# Patient Record
Sex: Female | Born: 1945 | Race: White | Hispanic: No | Smoking: Never smoker
Health system: Southern US, Community
[De-identification: ages and names within clinical notes are randomized; demographics above are authoritative.]

## PROBLEM LIST (undated history)

## (undated) DIAGNOSIS — E119 Type 2 diabetes mellitus without complications: Secondary | ICD-10-CM

## (undated) HISTORY — PX: DIAGNOSTIC LAPAROSCOPY: SUR761

## (undated) HISTORY — DX: Type 2 diabetes mellitus without complications: E11.9

## (undated) HISTORY — PX: APPENDECTOMY: SHX54

---

## 2004-06-07 ENCOUNTER — Ambulatory Visit: Payer: Self-pay | Admitting: Internal Medicine

## 2006-01-29 ENCOUNTER — Ambulatory Visit: Payer: Self-pay | Admitting: Internal Medicine

## 2010-04-11 ENCOUNTER — Ambulatory Visit: Payer: Self-pay | Admitting: Internal Medicine

## 2011-10-17 ENCOUNTER — Ambulatory Visit: Payer: Self-pay | Admitting: Internal Medicine

## 2012-10-20 ENCOUNTER — Ambulatory Visit: Payer: Self-pay | Admitting: Internal Medicine

## 2014-11-28 ENCOUNTER — Other Ambulatory Visit: Payer: Self-pay | Admitting: Internal Medicine

## 2014-11-28 DIAGNOSIS — Z1231 Encounter for screening mammogram for malignant neoplasm of breast: Secondary | ICD-10-CM

## 2019-01-22 DIAGNOSIS — C4491 Basal cell carcinoma of skin, unspecified: Secondary | ICD-10-CM

## 2019-01-22 HISTORY — DX: Basal cell carcinoma of skin, unspecified: C44.91

## 2019-02-26 ENCOUNTER — Other Ambulatory Visit: Payer: Self-pay | Admitting: Internal Medicine

## 2019-02-26 DIAGNOSIS — Z1231 Encounter for screening mammogram for malignant neoplasm of breast: Secondary | ICD-10-CM

## 2019-04-21 ENCOUNTER — Ambulatory Visit
Admission: RE | Admit: 2019-04-21 | Discharge: 2019-04-21 | Disposition: A | Discharge status: Home / Self Care | Payer: Medicare Other | Source: Ambulatory Visit | Attending: Internal Medicine | Admitting: Internal Medicine

## 2019-04-21 ENCOUNTER — Encounter: Payer: Self-pay | Admitting: Radiology

## 2019-04-21 DIAGNOSIS — Z1231 Encounter for screening mammogram for malignant neoplasm of breast: Secondary | ICD-10-CM

## 2019-04-28 ENCOUNTER — Ambulatory Visit: Payer: Self-pay

## 2019-08-16 ENCOUNTER — Ambulatory Visit: Payer: Medicare Other | Attending: Internal Medicine

## 2019-08-16 DIAGNOSIS — Z23 Encounter for immunization: Secondary | ICD-10-CM

## 2019-08-16 NOTE — Progress Notes (Signed)
   Covid-19 Vaccination Clinic  Name:  VANELY SHARBER    MRN: [id number] DOB: [birth year]  08/16/2019  Ms. Lemberger was observed post Covid-19 immunization for 15 minutes without incident. She was provided with Vaccine Information Sheet and instruction to access the V-Safe system.   Ms. Scheckel was instructed to call 911 with any severe reactions post vaccine: Marland Kitchen Difficulty breathing  . Swelling of face and throat  . A fast heartbeat  . A bad rash all over body  . Dizziness and weakness   Immunizations Administered    Name Date Dose VIS Date Route   Pfizer COVID-19 Vaccine 08/16/2019  8:17 AM 0.3 mL 04/23/2019 Intramuscular   Manufacturer: [place]   Lot: [id number]   [place]: [id number]

## 2019-09-14 ENCOUNTER — Encounter: Payer: Medicare Other | Admitting: Dermatology

## 2019-09-14 ENCOUNTER — Ambulatory Visit: Payer: Medicare Other

## 2019-10-01 ENCOUNTER — Ambulatory Visit: Payer: Medicare Other | Attending: Internal Medicine

## 2019-10-01 DIAGNOSIS — Z23 Encounter for immunization: Secondary | ICD-10-CM

## 2019-10-01 NOTE — Progress Notes (Signed)
   Covid-19 Vaccination Clinic  Name:  Alexandra Novak    MRN: [id number] DOB: 1946/02/10  10/01/2019  [name] was observed post Covid-19 immunization for 15 minutes without incident. She was provided with Vaccine Information Sheet and instruction to access the V-Safe system.   [name] was instructed to call 911 with any severe reactions post vaccine: Marland Kitchen Difficulty breathing  . Swelling of face and throat  . A fast heartbeat  . A bad rash all over body  . Dizziness and weakness   Immunizations Administered    Name Date Dose VIS Date Route   Pfizer COVID-19 Vaccine 10/01/2019  8:02 AM 0.3 mL 07/07/2018 Intramuscular   Manufacturer: [place]   Lot: [id number]   [place]: [id number]

## 2020-09-02 ENCOUNTER — Other Ambulatory Visit: Payer: Self-pay

## 2020-09-02 ENCOUNTER — Inpatient Hospital Stay
Admission: EM | Admit: 2020-09-02 | Discharge: 2020-09-04 | DRG: 339 | Disposition: A | Discharge status: Home / Self Care | Payer: Medicare Other | Attending: Surgery | Admitting: Surgery

## 2020-09-02 ENCOUNTER — Emergency Department: Payer: Medicare Other | Admitting: Anesthesiology

## 2020-09-02 ENCOUNTER — Emergency Department: Payer: Medicare Other

## 2020-09-02 ENCOUNTER — Encounter
Admission: EM | Disposition: A | Discharge status: Home / Self Care | Payer: Self-pay | Source: Home / Self Care | Attending: Surgery

## 2020-09-02 DIAGNOSIS — E119 Type 2 diabetes mellitus without complications: Secondary | ICD-10-CM

## 2020-09-02 DIAGNOSIS — C7A00 Malignant carcinoid tumor of unspecified site: Secondary | ICD-10-CM | POA: Diagnosis not present

## 2020-09-02 DIAGNOSIS — K3580 Unspecified acute appendicitis: Secondary | ICD-10-CM | POA: Diagnosis not present

## 2020-09-02 DIAGNOSIS — Z7984 Long term (current) use of oral hypoglycemic drugs: Secondary | ICD-10-CM | POA: Diagnosis not present

## 2020-09-02 DIAGNOSIS — E1122 Type 2 diabetes mellitus with diabetic chronic kidney disease: Secondary | ICD-10-CM | POA: Diagnosis present

## 2020-09-02 DIAGNOSIS — N736 Female pelvic peritoneal adhesions (postinfective): Secondary | ICD-10-CM | POA: Diagnosis present

## 2020-09-02 DIAGNOSIS — N179 Acute kidney failure, unspecified: Secondary | ICD-10-CM | POA: Diagnosis present

## 2020-09-02 DIAGNOSIS — N183 Type 2 diabetes mellitus with diabetic chronic kidney disease: Secondary | ICD-10-CM

## 2020-09-02 DIAGNOSIS — Z85828 Personal history of other malignant neoplasm of skin: Secondary | ICD-10-CM | POA: Diagnosis not present

## 2020-09-02 DIAGNOSIS — I129 Hypertensive chronic kidney disease with stage 1 through stage 4 chronic kidney disease, or unspecified chronic kidney disease: Secondary | ICD-10-CM | POA: Diagnosis present

## 2020-09-02 DIAGNOSIS — E785 Hyperlipidemia, unspecified: Secondary | ICD-10-CM | POA: Diagnosis present

## 2020-09-02 DIAGNOSIS — Z6841 Body Mass Index (BMI) 40.0 and over, adult: Secondary | ICD-10-CM | POA: Diagnosis not present

## 2020-09-02 DIAGNOSIS — I10 Essential (primary) hypertension: Secondary | ICD-10-CM

## 2020-09-02 DIAGNOSIS — K3532 Acute appendicitis with perforation and localized peritonitis, without abscess: Secondary | ICD-10-CM | POA: Diagnosis present

## 2020-09-02 DIAGNOSIS — K353 Acute appendicitis with localized peritonitis, without perforation or gangrene: Secondary | ICD-10-CM | POA: Diagnosis not present

## 2020-09-02 DIAGNOSIS — R109 Unspecified abdominal pain: Secondary | ICD-10-CM | POA: Diagnosis present

## 2020-09-02 DIAGNOSIS — A419 Sepsis, unspecified organism: Secondary | ICD-10-CM

## 2020-09-02 DIAGNOSIS — Z20822 Contact with and (suspected) exposure to covid-19: Secondary | ICD-10-CM | POA: Diagnosis present

## 2020-09-02 DIAGNOSIS — K859 Acute pancreatitis without necrosis or infection, unspecified: Secondary | ICD-10-CM | POA: Diagnosis present

## 2020-09-02 DIAGNOSIS — Z79899 Other long term (current) drug therapy: Secondary | ICD-10-CM

## 2020-09-02 DIAGNOSIS — Z794 Long term (current) use of insulin: Secondary | ICD-10-CM | POA: Diagnosis not present

## 2020-09-02 DIAGNOSIS — K3533 Acute appendicitis with perforation and localized peritonitis, with abscess: Secondary | ICD-10-CM

## 2020-09-02 DIAGNOSIS — C785 Secondary malignant neoplasm of large intestine and rectum: Secondary | ICD-10-CM | POA: Diagnosis not present

## 2020-09-02 DIAGNOSIS — N1832 Chronic kidney disease, stage 3b: Secondary | ICD-10-CM | POA: Diagnosis present

## 2020-09-02 HISTORY — PX: LAPAROSCOPIC APPENDECTOMY: SHX408

## 2020-09-02 LAB — LIPASE, BLOOD: Lipase: 20 U/L (ref 11–51)

## 2020-09-02 LAB — URINALYSIS, COMPLETE (UACMP) WITH MICROSCOPIC
Bilirubin Urine: NEGATIVE
Glucose, UA: NEGATIVE mg/dL
Hgb urine dipstick: NEGATIVE
Ketones, ur: 5 mg/dL — AB
Leukocytes,Ua: NEGATIVE
Nitrite: NEGATIVE
Protein, ur: NEGATIVE mg/dL
Specific Gravity, Urine: 1.016 (ref 1.005–1.030)
Squamous Epithelial / HPF: NONE SEEN (ref 0–5)
pH: 5 (ref 5.0–8.0)

## 2020-09-02 LAB — COMPREHENSIVE METABOLIC PANEL
ALT: 14 U/L (ref 0–44)
AST: 17 U/L (ref 15–41)
Albumin: 3.5 g/dL (ref 3.5–5.0)
Alkaline Phosphatase: 73 U/L (ref 38–126)
Anion gap: 13 (ref 5–15)
BUN: 29 mg/dL — ABNORMAL HIGH (ref 8–23)
CO2: 23 mmol/L (ref 22–32)
Calcium: 9 mg/dL (ref 8.9–10.3)
Chloride: 98 mmol/L (ref 98–111)
Creatinine, Ser: 1.18 mg/dL — ABNORMAL HIGH (ref 0.44–1.00)
GFR, Estimated: 48 mL/min — ABNORMAL LOW (ref >60)
Glucose, Bld: 185 mg/dL — ABNORMAL HIGH (ref 70–99)
Potassium: 3.6 mmol/L (ref 3.5–5.1)
Sodium: 134 mmol/L — ABNORMAL LOW (ref 135–145)
Total Bilirubin: 0.8 mg/dL (ref 0.3–1.2)
Total Protein: 8.1 g/dL (ref 6.5–8.1)

## 2020-09-02 LAB — CBC WITH DIFFERENTIAL/PLATELET
Abs Immature Granulocytes: 0.09 10*3/uL — ABNORMAL HIGH (ref 0.00–0.07)
Basophils Absolute: 0.1 10*3/uL (ref 0.0–0.1)
Basophils Relative: 0 %
Eosinophils Absolute: 0.1 10*3/uL (ref 0.0–0.5)
Eosinophils Relative: 0 %
HCT: 38.4 % (ref 36.0–46.0)
Hemoglobin: 12.9 g/dL (ref 12.0–15.0)
Immature Granulocytes: 0 %
Lymphocytes Relative: 25 %
Lymphs Abs: 5.1 10*3/uL — ABNORMAL HIGH (ref 0.7–4.0)
MCH: 29.7 pg (ref 26.0–34.0)
MCHC: 33.6 g/dL (ref 30.0–36.0)
MCV: 88.3 fL (ref 80.0–100.0)
Monocytes Absolute: 1.6 10*3/uL — ABNORMAL HIGH (ref 0.1–1.0)
Monocytes Relative: 8 %
Neutro Abs: 13.7 10*3/uL — ABNORMAL HIGH (ref 1.7–7.7)
Neutrophils Relative %: 67 %
Platelets: 426 10*3/uL — ABNORMAL HIGH (ref 150–400)
RBC: 4.35 MIL/uL (ref 3.87–5.11)
RDW: 13.5 % (ref 11.5–15.5)
WBC: 20.6 10*3/uL — ABNORMAL HIGH (ref 4.0–10.5)
nRBC: 0 % (ref 0.0–0.2)

## 2020-09-02 LAB — RESP PANEL BY RT-PCR (FLU A&B, COVID) ARPGX2
Influenza A by PCR: NEGATIVE
Influenza B by PCR: NEGATIVE
SARS Coronavirus 2 by RT PCR: NEGATIVE

## 2020-09-02 LAB — MAGNESIUM: Magnesium: 2.2 mg/dL (ref 1.7–2.4)

## 2020-09-02 LAB — CBG MONITORING, ED: Glucose-Capillary: 197 mg/dL — ABNORMAL HIGH (ref 70–99)

## 2020-09-02 SURGERY — APPENDECTOMY, LAPAROSCOPIC
Anesthesia: General

## 2020-09-02 MED ORDER — HYDROMORPHONE HCL 1 MG/ML IJ SOLN: 0.5000 mg | INTRAMUSCULAR | Status: DC | PRN

## 2020-09-02 MED ORDER — SODIUM CHLORIDE 0.9 % IV SOLN
1.0000 g | Freq: Once | INTRAVENOUS | Status: AC
  Administered 2020-09-02: 1 g via INTRAVENOUS
  Filled 2020-09-02: qty 10

## 2020-09-02 MED ORDER — POLYETHYLENE GLYCOL 3350 17 G PO PACK: 17.0000 g | PACK | Freq: Every day | ORAL | Status: DC | PRN

## 2020-09-02 MED ORDER — ROCURONIUM BROMIDE 100 MG/10ML IV SOLN
INTRAVENOUS | Status: DC | PRN
  Administered 2020-09-02: 10 mg via INTRAVENOUS
  Administered 2020-09-02: 40 mg via INTRAVENOUS
  Administered 2020-09-02: 10 mg via INTRAVENOUS

## 2020-09-02 MED ORDER — OXYCODONE HCL 5 MG PO TABS
ORAL_TABLET | ORAL | Status: AC
  Filled 2020-09-02: qty 1

## 2020-09-02 MED ORDER — LIDOCAINE HCL (CARDIAC) PF 100 MG/5ML IV SOSY
PREFILLED_SYRINGE | INTRAVENOUS | Status: DC | PRN
  Administered 2020-09-02: 100 mg via INTRAVENOUS

## 2020-09-02 MED ORDER — LACTATED RINGERS IV SOLN
125.0000 mL/h | INTRAVENOUS | Status: DC
  Administered 2020-09-02 – 2020-09-03 (×2): 125 mL/h via INTRAVENOUS

## 2020-09-02 MED ORDER — SUCCINYLCHOLINE CHLORIDE 200 MG/10ML IV SOSY
PREFILLED_SYRINGE | INTRAVENOUS | Status: AC
  Filled 2020-09-02: qty 10

## 2020-09-02 MED ORDER — METRONIDAZOLE IN NACL 5-0.79 MG/ML-% IV SOLN
500.0000 mg | Freq: Once | INTRAVENOUS | Status: AC
  Administered 2020-09-02: 500 mg via INTRAVENOUS
  Filled 2020-09-02: qty 100

## 2020-09-02 MED ORDER — HYDROCHLOROTHIAZIDE 25 MG PO TABS
25.0000 mg | ORAL_TABLET | Freq: Every day | ORAL | Status: DC
  Administered 2020-09-03 – 2020-09-04 (×2): 25 mg via ORAL
  Filled 2020-09-02 (×2): qty 1

## 2020-09-02 MED ORDER — FENTANYL CITRATE (PF) 100 MCG/2ML IJ SOLN: 25.0000 ug | INTRAMUSCULAR | Status: DC | PRN

## 2020-09-02 MED ORDER — KETOROLAC TROMETHAMINE 30 MG/ML IJ SOLN: 15.0000 mg | Freq: Four times a day (QID) | INTRAMUSCULAR | Status: DC | PRN

## 2020-09-02 MED ORDER — ROCURONIUM BROMIDE 10 MG/ML (PF) SYRINGE
PREFILLED_SYRINGE | INTRAVENOUS | Status: AC
  Filled 2020-09-02: qty 10

## 2020-09-02 MED ORDER — DEXAMETHASONE SODIUM PHOSPHATE 10 MG/ML IJ SOLN
INTRAMUSCULAR | Status: DC | PRN
  Administered 2020-09-02: 5 mg via INTRAVENOUS

## 2020-09-02 MED ORDER — OXYCODONE HCL 5 MG PO TABS
5.0000 mg | ORAL_TABLET | ORAL | Status: DC | PRN
  Administered 2020-09-02: 5 mg via ORAL

## 2020-09-02 MED ORDER — ACETAMINOPHEN 500 MG PO TABS
1000.0000 mg | ORAL_TABLET | Freq: Four times a day (QID) | ORAL | Status: DC | PRN
  Administered 2020-09-03 – 2020-09-04 (×2): 1000 mg via ORAL
  Filled 2020-09-02 (×2): qty 2

## 2020-09-02 MED ORDER — ONDANSETRON HCL 4 MG/2ML IJ SOLN: 4.0000 mg | Freq: Four times a day (QID) | INTRAMUSCULAR | Status: DC | PRN

## 2020-09-02 MED ORDER — ONDANSETRON HCL 4 MG/2ML IJ SOLN: 4.0000 mg | Freq: Once | INTRAMUSCULAR | Status: DC | PRN

## 2020-09-02 MED ORDER — AMLODIPINE BESYLATE 10 MG PO TABS
10.0000 mg | ORAL_TABLET | Freq: Every day | ORAL | Status: DC
  Administered 2020-09-03 – 2020-09-04 (×2): 10 mg via ORAL
  Filled 2020-09-02 (×2): qty 1

## 2020-09-02 MED ORDER — DEXMEDETOMIDINE HCL 200 MCG/2ML IV SOLN
INTRAVENOUS | Status: DC | PRN
  Administered 2020-09-02: 4 ug via INTRAVENOUS
  Administered 2020-09-02: 12 ug via INTRAVENOUS

## 2020-09-02 MED ORDER — SUGAMMADEX SODIUM 200 MG/2ML IV SOLN
INTRAVENOUS | Status: DC | PRN
  Administered 2020-09-02: 500 mg via INTRAVENOUS

## 2020-09-02 MED ORDER — FENTANYL CITRATE (PF) 100 MCG/2ML IJ SOLN
INTRAMUSCULAR | Status: DC | PRN
  Administered 2020-09-02: 25 ug via INTRAVENOUS
  Administered 2020-09-02: 50 ug via INTRAVENOUS

## 2020-09-02 MED ORDER — INSULIN ASPART 100 UNIT/ML ~~LOC~~ SOLN: 0.0000 [IU] | Freq: Every day | SUBCUTANEOUS | Status: DC

## 2020-09-02 MED ORDER — ACETAMINOPHEN 10 MG/ML IV SOLN
INTRAVENOUS | Status: DC | PRN
  Administered 2020-09-02: 1000 mg via INTRAVENOUS

## 2020-09-02 MED ORDER — ONDANSETRON 4 MG PO TBDP: 4.0000 mg | ORAL_TABLET | Freq: Four times a day (QID) | ORAL | Status: DC | PRN

## 2020-09-02 MED ORDER — PHENYLEPHRINE HCL (PRESSORS) 10 MG/ML IV SOLN
INTRAVENOUS | Status: DC | PRN
  Administered 2020-09-02 (×2): 100 ug via INTRAVENOUS
  Administered 2020-09-02 (×3): 50 ug via INTRAVENOUS

## 2020-09-02 MED ORDER — LACTATED RINGERS IV SOLN: INTRAVENOUS | Status: DC

## 2020-09-02 MED ORDER — SUGAMMADEX SODIUM 500 MG/5ML IV SOLN
INTRAVENOUS | Status: AC
  Filled 2020-09-02: qty 5

## 2020-09-02 MED ORDER — ACETAMINOPHEN 10 MG/ML IV SOLN
INTRAVENOUS | Status: AC
  Filled 2020-09-02: qty 100

## 2020-09-02 MED ORDER — IOHEXOL 300 MG/ML  SOLN
85.0000 mL | Freq: Once | INTRAMUSCULAR | Status: AC | PRN
  Administered 2020-09-02: 85 mL via INTRAVENOUS

## 2020-09-02 MED ORDER — FENTANYL CITRATE (PF) 100 MCG/2ML IJ SOLN
INTRAMUSCULAR | Status: AC
  Filled 2020-09-02: qty 2

## 2020-09-02 MED ORDER — PROPOFOL 10 MG/ML IV BOLUS
INTRAVENOUS | Status: DC | PRN
  Administered 2020-09-02: 120 mg via INTRAVENOUS

## 2020-09-02 MED ORDER — INSULIN ASPART 100 UNIT/ML ~~LOC~~ SOLN
0.0000 [IU] | Freq: Three times a day (TID) | SUBCUTANEOUS | Status: DC
  Administered 2020-09-03: 3 [IU] via SUBCUTANEOUS
  Administered 2020-09-03 (×2): 7 [IU] via SUBCUTANEOUS
  Administered 2020-09-04: 3 [IU] via SUBCUTANEOUS
  Filled 2020-09-02 (×4): qty 1

## 2020-09-02 MED ORDER — METOPROLOL TARTRATE 5 MG/5ML IV SOLN
INTRAVENOUS | Status: AC
  Filled 2020-09-02: qty 5

## 2020-09-02 MED ORDER — ONDANSETRON HCL 4 MG/2ML IJ SOLN
4.0000 mg | Freq: Once | INTRAMUSCULAR | Status: DC
  Filled 2020-09-02: qty 2

## 2020-09-02 MED ORDER — LACTATED RINGERS IV BOLUS
1000.0000 mL | Freq: Once | INTRAVENOUS | Status: AC
  Administered 2020-09-02: 1000 mL via INTRAVENOUS

## 2020-09-02 MED ORDER — SUCCINYLCHOLINE CHLORIDE 20 MG/ML IJ SOLN
INTRAMUSCULAR | Status: DC | PRN
  Administered 2020-09-02: 120 mg via INTRAVENOUS

## 2020-09-02 MED ORDER — PHENYLEPHRINE HCL (PRESSORS) 10 MG/ML IV SOLN
INTRAVENOUS | Status: AC
  Filled 2020-09-02: qty 1

## 2020-09-02 MED ORDER — PRAVASTATIN SODIUM 40 MG PO TABS
40.0000 mg | ORAL_TABLET | Freq: Every day | ORAL | Status: DC
  Administered 2020-09-03: 40 mg via ORAL
  Filled 2020-09-02: qty 1
  Filled 2020-09-02: qty 2
  Filled 2020-09-02: qty 1

## 2020-09-02 MED ORDER — ONDANSETRON HCL 4 MG/2ML IJ SOLN
INTRAMUSCULAR | Status: DC | PRN
  Administered 2020-09-02: 4 mg via INTRAVENOUS

## 2020-09-02 MED ORDER — DEXMEDETOMIDINE (PRECEDEX) IN NS 20 MCG/5ML (4 MCG/ML) IV SYRINGE
PREFILLED_SYRINGE | INTRAVENOUS | Status: AC
  Filled 2020-09-02: qty 5

## 2020-09-02 MED ORDER — PIPERACILLIN-TAZOBACTAM 3.375 G IVPB
3.3750 g | Freq: Three times a day (TID) | INTRAVENOUS | Status: DC
  Administered 2020-09-02 – 2020-09-04 (×5): 3.375 g via INTRAVENOUS
  Filled 2020-09-02 (×3): qty 50

## 2020-09-02 MED ORDER — LISINOPRIL 10 MG PO TABS
20.0000 mg | ORAL_TABLET | Freq: Two times a day (BID) | ORAL | Status: DC
  Administered 2020-09-03 – 2020-09-04 (×3): 20 mg via ORAL
  Filled 2020-09-02 (×3): qty 2

## 2020-09-02 MED ORDER — PANTOPRAZOLE SODIUM 40 MG IV SOLR
40.0000 mg | Freq: Every day | INTRAVENOUS | Status: DC
  Administered 2020-09-02 – 2020-09-03 (×2): 40 mg via INTRAVENOUS
  Filled 2020-09-02 (×2): qty 40

## 2020-09-02 MED ORDER — BUPIVACAINE-EPINEPHRINE (PF) 0.5% -1:200000 IJ SOLN
INTRAMUSCULAR | Status: DC | PRN
  Administered 2020-09-02: 30 mL via PERINEURAL

## 2020-09-02 SURGICAL SUPPLY — 40 items
CANISTER SUCT 1200ML W/VALVE (MISCELLANEOUS) ×2 IMPLANT
CHLORAPREP W/TINT 26 (MISCELLANEOUS) ×2 IMPLANT
COVER WAND RF STERILE (DRAPES) ×2 IMPLANT
CUTTER FLEX LINEAR 45M (STAPLE) ×2 IMPLANT
DERMABOND ADVANCED (GAUZE/BANDAGES/DRESSINGS) ×1
DERMABOND ADVANCED .7 DNX12 (GAUZE/BANDAGES/DRESSINGS) ×1 IMPLANT
ELECT CAUTERY BLADE 6.4 (BLADE) ×2 IMPLANT
ELECT REM PT RETURN 9FT ADLT (ELECTROSURGICAL) ×2
ELECTRODE REM PT RTRN 9FT ADLT (ELECTROSURGICAL) ×1 IMPLANT
GLOVE SURG SYN 7.0 (GLOVE) ×2 IMPLANT
GLOVE SURG SYN 7.5  E (GLOVE) ×1
GLOVE SURG SYN 7.5 E (GLOVE) ×1 IMPLANT
GOWN STRL REUS W/ TWL LRG LVL3 (GOWN DISPOSABLE) ×2 IMPLANT
GOWN STRL REUS W/TWL LRG LVL3 (GOWN DISPOSABLE) ×2
IRRIGATION STRYKERFLOW (MISCELLANEOUS) ×1 IMPLANT
IRRIGATOR STRYKERFLOW (MISCELLANEOUS) ×2
IV NS 1000ML (IV SOLUTION) ×1
IV NS 1000ML BAXH (IV SOLUTION) ×1 IMPLANT
KIT TURNOVER KIT A (KITS) ×2 IMPLANT
LABEL OR SOLS (LABEL) ×2 IMPLANT
LIGASURE LAP MARYLAND 5MM 37CM (ELECTROSURGICAL) ×2 IMPLANT
MANIFOLD NEPTUNE II (INSTRUMENTS) ×2 IMPLANT
NEEDLE HYPO 22GX1.5 SAFETY (NEEDLE) ×2 IMPLANT
NS IRRIG 500ML POUR BTL (IV SOLUTION) ×2 IMPLANT
PACK LAP CHOLECYSTECTOMY (MISCELLANEOUS) ×2 IMPLANT
PENCIL ELECTRO HAND CTR (MISCELLANEOUS) ×2 IMPLANT
POUCH SPECIMEN RETRIEVAL 10MM (ENDOMECHANICALS) ×2 IMPLANT
RELOAD 45 VASCULAR/THIN (ENDOMECHANICALS) ×2 IMPLANT
RELOAD STAPLE TA45 3.5 REG BLU (ENDOMECHANICALS) ×2 IMPLANT
SCISSORS METZENBAUM CVD 33 (INSTRUMENTS) ×2 IMPLANT
SLEEVE ADV FIXATION 5X100MM (TROCAR) ×2 IMPLANT
SUT MNCRL 4-0 (SUTURE) ×1
SUT MNCRL 4-0 27XMFL (SUTURE) ×1
SUT VICRYL 0 AB UR-6 (SUTURE) ×2 IMPLANT
SUTURE MNCRL 4-0 27XMF (SUTURE) ×1 IMPLANT
SYS KII FIOS ACCESS ABD 5X100 (TROCAR) ×2
SYSTEM KII FIOS ACES ABD 5X100 (TROCAR) ×1 IMPLANT
TRAY FOLEY MTR SLVR 16FR STAT (SET/KITS/TRAYS/PACK) ×2 IMPLANT
TROCAR BALLN GELPORT 12X130M (ENDOMECHANICALS) ×2 IMPLANT
TUBING EVAC SMOKE HEATED PNEUM (TUBING) ×2 IMPLANT

## 2020-09-02 NOTE — ED Triage Notes (Signed)
Pt states she has been having abdominal pain since Tuesday morning- pt was sent by [facility] d/t elevated white count and elevated creatinine pt denies n/v/d but is unable to eat anything- pt denies fever

## 2020-09-02 NOTE — H&P (Signed)
Date of Admission:  09/02/2020  Reason for Admission:  Acute appendicitis  History of Present Illness: Alexandra Novak is a 75 y.o. female presenting with a 5 day history of abdominal pain.  The patient reports that pain started around the umbilicus first and then started moving to the right lower quadrant.  She did have some nausea early on, but no emesis.  She also reports feeling febrile but did not check her temperature.  At first she thought she may have diverticulitis and went to the walk-in clinic today.  Her WBC was 20.1 and was referred to the ER for further evaluation.  In the ED, her workup showed also an elevated WBC of 20.6, Cr of 1.18.  She had a CT scan of abdomen/pelvis which showed acute appendicitis.  There's no free air or abscess, but on my read, the distal end of the appendix is very unclear, likely reflecting distal rupture.  She reports her pain right now is better compared to earlier.  Past Medical History: Past Medical History:  Diagnosis Date  . Basal cell carcinoma 01/22/2019   Left upper arm. Nodular pattern. Tx: EDC  . Diabetes mellitus without complication [facility])      Past Surgical History: --No prior surgeries.  Home Medications: Prior to Admission medications   Medication Sig Start Date End Date Taking? Authorizing Provider  ACCU-CHEK AVIVA PLUS test strip daily. as directed 07/14/19   [provider]  Accu-Chek Softclix Lancets lancets daily. 07/17/19   [provider]  amLODipine (NORVASC) 10 MG tablet TAKE 1 TABLET(10 MG) BY MOUTH EVERY DAY 08/18/19   [provider]  HUMALOG 100 UNIT/ML injection IN 20 UNITS UNDER THE SKIN BEFORE BREAKFAST AND 30 UNITS BEFORE SUPPER 08/04/19   [provider]  hydrochlorothiazide (HYDRODIURIL) 25 MG tablet Take 25 mg by mouth daily. 07/19/19   [provider]  insulin glargine (LANTUS) 100 UNIT/ML injection INJECT 80 UNIS UNDER THE SKIN EVERY NIGHT AT BEDTIME 09/06/19   [provider]  lisinopril (ZESTRIL) 20 MG tablet Take 20 mg by mouth 2 (two) times daily. 06/16/19   [provider]  metFORMIN (GLUCOPHAGE) 500 MG tablet Take 500 mg by mouth daily. 07/22/19   [provider]  pravastatin (PRAVACHOL) 40 MG tablet TAKE 1 TABLET BY MOUTH NIGHTLY 08/09/19   [provider]    Allergies: No Known Allergies  Social History:  reports that she has never smoked. She has never used smokeless tobacco. No history on file for alcohol use and drug use.   Family History: History reviewed. No pertinent family history.  Review of Systems: Review of Systems  Constitutional: Positive for fever. Negative for chills.  HENT: Negative for hearing loss.   Respiratory: Negative for shortness of breath.   Cardiovascular: Negative for chest pain.  Gastrointestinal: Positive for abdominal pain and nausea. Negative for constipation, diarrhea and vomiting.  Genitourinary: Negative for dysuria.  Musculoskeletal: Negative for myalgias.  Skin: Negative for rash.  Neurological: Negative for dizziness.  Psychiatric/Behavioral: Negative for depression.    Physical Exam BP 129/69   Pulse 100   Temp 98 F (36.7 C) (Oral)   Resp 16   Ht 5\' 1"  (1.549 m)   Wt 98.4 kg   SpO2 98%   BMI 41.00 kg/m  CONSTITUTIONAL: No acute distress HEENT:  Normocephalic, atraumatic, extraocular motion intact. NECK: Trachea is midline, and there is no jugular venous distension.  RESPIRATORY:  Lungs are clear, and breath sounds are equal bilaterally. Normal  respiratory effort without pathologic use of accessory muscles. CARDIOVASCULAR: Heart is regular without murmurs, gallops, or rubs. GI: The abdomen is soft, obese, non-distended, currently non-tender to palpation.  MUSCULOSKELETAL:  Normal muscle strength and tone in all four extremities.  No peripheral edema or cyanosis. SKIN: Skin turgor is normal. There are no pathologic skin lesions.  NEUROLOGIC:  Motor and  sensation is grossly normal.  Cranial nerves are grossly intact. PSYCH:  Alert and oriented to person, place and time. Affect is normal.  Laboratory Analysis: Results for orders placed or performed during the hospital encounter of 09/02/20 (from the past 24 hour(s))  Comprehensive metabolic panel     Status: Abnormal   Collection Time: 09/02/20 12:00 PM  Result Value Ref Range   Sodium 134 (L) 135 - 145 mmol/L   Potassium 3.6 3.5 - 5.1 mmol/L   Chloride 98 98 - 111 mmol/L   CO2 23 22 - 32 mmol/L   Glucose, Bld 185 (H) 70 - 99 mg/dL   BUN 29 (H) 8 - 23 mg/dL   Creatinine, Ser 1.18 (H) 0.44 - 1.00 mg/dL   Calcium 9.0 8.9 - 10.3 mg/dL   Total Protein 8.1 6.5 - 8.1 g/dL   Albumin 3.5 3.5 - 5.0 g/dL   AST 17 15 - 41 U/L   ALT 14 0 - 44 U/L   Alkaline Phosphatase 73 38 - 126 U/L   Total Bilirubin 0.8 0.3 - 1.2 mg/dL   GFR, Estimated 48 (L) >60 mL/min   Anion gap 13 5 - 15  CBC with Differential/Platelet     Status: Abnormal   Collection Time: 09/02/20 12:00 PM  Result Value Ref Range   WBC 20.6 (H) 4.0 - 10.5 K/uL   RBC 4.35 3.87 - 5.11 MIL/uL   Hemoglobin 12.9 12.0 - 15.0 g/dL   HCT 38.4 36.0 - 46.0 %   MCV 88.3 80.0 - 100.0 fL   MCH 29.7 26.0 - 34.0 pg   MCHC 33.6 30.0 - 36.0 g/dL   RDW 13.5 11.5 - 15.5 %   Platelets 426 (H) 150 - 400 K/uL   nRBC 0.0 0.0 - 0.2 %   Neutrophils Relative % 67 %   Neutro Abs 13.7 (H) 1.7 - 7.7 K/uL   Lymphocytes Relative 25 %   Lymphs Abs 5.1 (H) 0.7 - 4.0 K/uL   Monocytes Relative 8 %   Monocytes Absolute 1.6 (H) 0.1 - 1.0 K/uL   Eosinophils Relative 0 %   Eosinophils Absolute 0.1 0.0 - 0.5 K/uL   Basophils Relative 0 %   Basophils Absolute 0.1 0.0 - 0.1 K/uL   Immature Granulocytes 0 %   Abs Immature Granulocytes 0.09 (H) 0.00 - 0.07 K/uL  Magnesium     Status: None   Collection Time: 09/02/20 12:00 PM  Result Value Ref Range   Magnesium 2.2 1.7 - 2.4 mg/dL  Lipase, blood     Status: None   Collection Time: 09/02/20 12:00 PM  Result  Value Ref Range   Lipase 20 11 - 51 U/L  Urinalysis, Complete w Microscopic Urine, Clean Catch     Status: Abnormal   Collection Time: 09/02/20  1:15 PM  Result Value Ref Range   Color, Urine YELLOW (A) YELLOW   APPearance CLEAR (A) CLEAR   Specific Gravity, Urine 1.016 1.005 - 1.030   pH 5.0 5.0 - 8.0   Glucose, UA NEGATIVE NEGATIVE mg/dL   Hgb urine dipstick NEGATIVE NEGATIVE   Bilirubin Urine NEGATIVE NEGATIVE  Ketones, ur 5 (A) NEGATIVE mg/dL   Protein, ur NEGATIVE NEGATIVE mg/dL   Nitrite NEGATIVE NEGATIVE   Leukocytes,Ua NEGATIVE NEGATIVE   RBC / HPF 0-5 0 - 5 RBC/hpf   WBC, UA 0-5 0 - 5 WBC/hpf   Bacteria, UA RARE (A) NONE SEEN   Squamous Epithelial / LPF NONE SEEN 0 - 5   Hyaline Casts, UA PRESENT   Resp Panel by RT-PCR (Flu A&B, Covid) Nasopharyngeal Swab     Status: None   Collection Time: 09/02/20  2:38 PM   Specimen: Nasopharyngeal Swab; Nasopharyngeal(NP) swabs in vial transport medium  Result Value Ref Range   SARS Coronavirus 2 by RT PCR NEGATIVE NEGATIVE   Influenza A by PCR NEGATIVE NEGATIVE   Influenza B by PCR NEGATIVE NEGATIVE    Imaging: CT ABDOMEN PELVIS W CONTRAST  Result Date: 09/02/2020 CLINICAL DATA:  Leukocytosis, lower abdominal pain, constipation. EXAM: CT ABDOMEN AND PELVIS WITH CONTRAST TECHNIQUE: Multidetector CT imaging of the abdomen and pelvis was performed using the standard protocol following bolus administration of intravenous contrast. CONTRAST:  17mL OMNIPAQUE IOHEXOL 300 MG/ML  SOLN COMPARISON:  Report from a abdominal ultrasound dated 08/20/2000. FINDINGS: Lower chest: Mild tree in bud opacities in the lingula are partially imaged. Hepatobiliary: A 1.3 cm hypoattenuating lesion is seen in hepatic segment 6 (series 2, image 30). No gallstones, gallbladder wall thickening, or biliary dilatation. Pancreas: Unremarkable. No pancreatic ductal dilatation or surrounding inflammatory changes. Spleen: Normal in size without focal abnormality.  Adrenals/Urinary Tract: Adrenal glands are unremarkable. Bilateral renal cysts measure up to 3.3 cm on the right and 2.1 cm on the left. A nonobstructive left renal calculus measures 4 mm. No hydronephrosis on either side. Bladder is unremarkable. Stomach/Bowel: There is a small hiatal hernia. The right lower quadrant appendix is enlarged, measuring up to 1.1 cm in diameter, with associated inflammatory changes, consistent with acute appendicitis. No well-defined fluid collection to suggest abscess is identified. Colonic diverticula are noted. Inflammatory changes involve portions of the sigmoid colon, however this is favored to reflect reactive changes from appendicitis as opposed to diverticulitis as the etiology. No evidence of bowel obstruction or fistula formation. Vascular/Lymphatic: Aortic atherosclerosis. No enlarged abdominal or pelvic lymph nodes. Reproductive: Uterus and bilateral adnexa are unremarkable. Other: Small fat containing midline ventral abdominal wall hernias are noted. No free intraperitoneal air. Musculoskeletal: 5 mm anterolisthesis of L4 on L5 is likely chronic. Degenerative changes are seen in the spine. IMPRESSION: 1. Acute appendicitis. No well-defined fluid collection to suggest abscess. 2. Inflammatory changes involve portions of the sigmoid colon, however this is favored to reflect reactive changes from appendicitis as opposed to diverticulitis as the etiology. 3. Mild tree in bud opacities in the lingula are partially imaged, but may reflect an infectious or inflammatory process. 4. Indeterminate 1.3 cm hypoattenuating lesion in hepatic segment 6. Recommend nonemergent MRI of the abdomen without and with contrast for further characterization. Aortic Atherosclerosis (ICD10-I70.0). Electronically Signed   By: Zerita Boers M.D.   On: 09/02/2020 14:02    Assessment and Plan: This is a 75 y.o. female with acute appendicitis.  --Discussed with the patient that although there's no  abscess or free air, I do think that perhaps the distal end of the appendix has ruptured.  This could be also why her pain has improved, as the pressure has released.  Discussed with her that we would take her to the OR for a laparoscopic appendectomy.  Discussed with her hospital stay, the surgery at  length, and all of her questions have been answered. --Admit to surgical team, keep NPO, start IV antibiotics, OR as soon as available.  Will also consult hospitalist team to assist [place] with her diabetes/HTN management.   Melvyn Neth, MD [facility]g:  [phone]

## 2020-09-02 NOTE — Anesthesia Preprocedure Evaluation (Signed)
Anesthesia Evaluation  Patient identified by MRN, date of birth, ID band Patient awake    Reviewed: Allergy & Precautions, NPO status , Patient's Chart, lab work & pertinent test results  Airway Mallampati: III       Dental   Pulmonary neg pulmonary ROS,    Pulmonary exam normal        Cardiovascular hypertension, Normal cardiovascular exam     Neuro/Psych negative neurological ROS  negative psych ROS   GI/Hepatic Neg liver ROS,   Endo/Other  diabetes  Renal/GU negative Renal ROS  negative genitourinary   Musculoskeletal negative musculoskeletal ROS (+)   Abdominal Normal abdominal exam  (+)   Peds negative pediatric ROS (+)  Hematology negative hematology ROS (+)   Anesthesia Other Findings Past Medical History: 01/22/2019: Basal cell carcinoma     Comment:  Left upper arm. Nodular pattern. Tx: EDC No date: Diabetes mellitus without complication (HCC)  Reproductive/Obstetrics                             Anesthesia Physical Anesthesia Plan  ASA: II and emergent  Anesthesia Plan: General   Post-op Pain Management:    Induction: Intravenous, Rapid sequence and Cricoid pressure planned  PONV Risk Score and Plan:   Airway Management Planned: Oral ETT  Additional Equipment:   Intra-op Plan:   Post-operative Plan: Extubation in OR  Informed Consent: I have reviewed the patients History and Physical, chart, labs and discussed the procedure including the risks, benefits and alternatives for the proposed anesthesia with the patient or authorized representative who has indicated his/her understanding and acceptance.     Dental advisory given  Plan Discussed with: CRNA and Surgeon  Anesthesia Plan Comments:         Anesthesia Quick Evaluation

## 2020-09-02 NOTE — ED Provider Notes (Signed)
[facility] Emergency Department Provider Note ____________________________________________   Event Date/Time   First MD Initiated Contact with Patient 09/02/20 1129     (approximate)  I have reviewed the triage vital signs and the nursing notes.  HISTORY  Chief Complaint Abdominal Pain   HPI Alexandra Novak is a 75 y.o. femalewho presents to the ED for evaluation of abd pain  Chart review indicates hx of diabetes, HTN  Patient presents via POV with her daughter due to abnormal outpatient labs and 4-5 days of lower abdominal pain. Patient went to her PCP this morning had blood drawn and a white count of 20,000.  She was seen due to her week of abdominal pain.  Patient reports intermittent lower abdominal pain over the past 1 week with subjective fevers.  She reports decreased stooling without diarrhea, hematochezia or melena.  Reports some nausea earlier in the week that is since resolved without emesis.  She does report decreased p.o. intake due to feeling poorly and intermittent nausea.  Past Medical History:  Diagnosis Date  . Basal cell carcinoma 01/22/2019   Left upper arm. Nodular pattern. Tx: EDC  . Diabetes mellitus without complication ([place])     There are no problems to display for this patient.   History reviewed. No pertinent surgical history.  Prior to Admission medications   Medication Sig Start Date End Date Taking? Authorizing Provider  ACCU-CHEK AVIVA PLUS test strip daily. as directed 07/14/19   [provider]  Accu-Chek Softclix Lancets lancets daily. 07/17/19   [provider]  amLODipine (NORVASC) 10 MG tablet TAKE 1 TABLET(10 MG) BY MOUTH EVERY DAY 08/18/19   [provider]  HUMALOG 100 UNIT/ML injection IN 20 UNITS UNDER THE SKIN BEFORE BREAKFAST AND 30 UNITS BEFORE SUPPER 08/04/19   [provider]  hydrochlorothiazide (HYDRODIURIL) 25 MG tablet Take 25 mg by mouth daily. 07/19/19   [provider]  insulin glargine (LANTUS) 100 UNIT/ML injection INJECT 80 UNIS UNDER THE SKIN EVERY NIGHT AT BEDTIME 09/06/19   [provider]  lisinopril (ZESTRIL) 20 MG tablet Take 20 mg by mouth 2 (two) times daily. 06/16/19   [provider]  metFORMIN (GLUCOPHAGE) 500 MG tablet Take 500 mg by mouth daily. 07/22/19   [provider]  pravastatin (PRAVACHOL) 40 MG tablet TAKE 1 TABLET BY MOUTH NIGHTLY 08/09/19   [provider]    Allergies Patient has no known allergies.  No family history on file.  Social History Social History   Tobacco Use  . Smoking status: Never Smoker  . Smokeless tobacco: Never Used    Review of Systems  Constitutional: No fever/chills Eyes: No visual changes. ENT: No sore throat. Cardiovascular: Denies chest pain. Respiratory: Denies shortness of breath. Gastrointestinal: no vomiting.  No diarrhea.  Positive for abdominal pain, nausea Genitourinary: Negative for dysuria. Musculoskeletal: Negative for back pain. Skin: Negative for rash. Neurological: Negative for headaches, focal weakness or numbness.  ____________________________________________   PHYSICAL EXAM:  VITAL SIGNS: Vitals:   09/02/20 1230 09/02/20 1245  BP:    Pulse: 92 94  Resp:    Temp:    SpO2: 95% 97%     Constitutional: Alert and oriented. Well appearing and in no acute distress.  Obese, pleasant and conversational in full sentences. Eyes: Conjunctivae are normal. PERRL. EOMI. Head: Atraumatic. Nose: No congestion/rhinnorhea. Mouth/Throat: Mucous membranes are dry.  Oropharynx non-erythematous. Neck: No stridor. No cervical spine tenderness to palpation. Cardiovascular: Tachycardic rate, regular rhythm.  Grossly normal heart sounds.  Good peripheral circulation. Respiratory: Normal respiratory effort.  No retractions. Lungs CTAB. Gastrointestinal: Soft , nondistended. No CVA tenderness. Diffuse lower quadrant tenderness, primarily RLQ  and LLQ with voluntary guarding.  Upper abdomen is benign. Musculoskeletal: No lower extremity tenderness nor edema.  No joint effusions. No signs of acute trauma. Neurologic:  Normal speech and language. No gross focal neurologic deficits are appreciated. No gait instability noted. Skin:  Skin is warm, dry and intact. No rash noted. Psychiatric: Mood and affect are normal. Speech and behavior are normal.  ____________________________________________   LABS (all labs ordered are listed, but only abnormal results are displayed)  Labs Reviewed  COMPREHENSIVE METABOLIC PANEL - Abnormal; Notable for the following components:      Result Value   Sodium 134 (*)    Glucose, Bld 185 (*)    BUN 29 (*)    Creatinine, Ser 1.18 (*)    GFR, Estimated 48 (*)    All other components within normal limits  CBC WITH DIFFERENTIAL/PLATELET - Abnormal; Notable for the following components:   WBC 20.6 (*)    Platelets 426 (*)    Neutro Abs 13.7 (*)    Lymphs Abs 5.1 (*)    Monocytes Absolute 1.6 (*)    Abs Immature Granulocytes 0.09 (*)    All other components within normal limits  URINALYSIS, COMPLETE (UACMP) WITH MICROSCOPIC - Abnormal; Notable for the following components:   Color, Urine YELLOW (*)    APPearance CLEAR (*)    Ketones, ur 5 (*)    Bacteria, UA RARE (*)    All other components within normal limits  RESP PANEL BY RT-PCR (FLU A&B, COVID) ARPGX2  CULTURE, BLOOD (SINGLE)  MAGNESIUM  LIPASE, BLOOD    ____________________________________________  RADIOLOGY  ED MD interpretation: CT viewed by me with evidence of acute appendicitis  Official radiology report(s): No results found.  ____________________________________________   PROCEDURES and INTERVENTIONS  Procedure(s) performed (including Critical Care):  .1-3 Lead EKG Interpretation Performed by: Vladimir Crofts, MD Authorized by: Vladimir Crofts, MD     Interpretation: abnormal     ECG rate:  104   ECG rate assessment:  tachycardic     Rhythm: sinus tachycardia     Ectopy: none     Conduction: normal    .Critical Care Performed by: Vladimir Crofts, MD Authorized by: Vladimir Crofts, MD   Critical care provider statement:    Critical care time (minutes):  45   Critical care was necessary to treat or prevent imminent or life-threatening deterioration of the following conditions:  Sepsis   Critical care was time spent personally by me on the following activities:  Discussions with consultants, evaluation of patient's response to treatment, examination of patient, ordering and performing treatments and interventions, ordering and review of laboratory studies, ordering and review of radiographic studies, pulse oximetry, re-evaluation of patient's condition, obtaining history from patient or surrogate and review of old charts    Medications  ondansetron (ZOFRAN) injection 4 mg (4 mg Intravenous Patient Refused/Not Given 09/02/20 1152)  cefTRIAXone (ROCEPHIN) 1 g in sodium chloride 0.9 % 100 mL IVPB (1 g Intravenous New Bag/Given 09/02/20 1439)  metroNIDAZOLE (FLAGYL) IVPB 500 mg (has no administration in time range)  lactated ringers infusion (has no administration in time range)  lactated ringers bolus 1,000 mL (0 mLs Intravenous Stopped 09/02/20 1438)  iohexol (OMNIPAQUE) 300 MG/ML solution 85 mL (85 mLs Intravenous Contrast Given 09/02/20 1327)    ____________________________________________   MDM /  ED COURSE   75 year old woman with history diabetes presents to the ED with evidence of sepsis attributed to acute appendicitis requiring surgical evaluation.  She presents tachycardic and hemodynamically stable.  Blood work with leukocytosis.  Urine without infectious features.  CT obtained due to concern for diverticulitis versus appendicitis, does demonstrate evidence of acute appendicitis as the likely source of her symptoms.  We will discussed the case with surgery for appendectomy.  Rocephin and Flagyl  provided.  Clinical Course as of 09/02/20 1451  Sat Sep 02, 2020  1422 Reassessed.  Patient sitting up next to her daughter and looks well.  Reports her pain is controlled.  We discussed CT findings and my recommendations for surgery.  She is agreeable. [DS]  [place] since 630a [DS]    Clinical Course User Index [DS] Vladimir Crofts, MD    ____________________________________________   FINAL CLINICAL IMPRESSION(S) / ED DIAGNOSES  Final diagnoses:  Acute appendicitis with localized peritonitis, without perforation, abscess, or gangrene  Sepsis without acute organ dysfunction, due to unspecified organism [facility])     ED Discharge Orders    None       Kenric Ginger   Note:  This document was prepared using Dragon voice recognition software and may include unintentional dictation errors.   Vladimir Crofts, MD 09/02/20 1452

## 2020-09-02 NOTE — Transfer of Care (Signed)
Immediate Anesthesia Transfer of Care Note  Patient: Sharene Krikorian [facility]  Procedure(s) Performed: APPENDECTOMY LAPAROSCOPIC (N/A )  Patient Location: PACU  Anesthesia Type:General  Level of Consciousness: drowsy  Airway & Oxygen Therapy: Patient Spontanous Breathing and Patient connected to face mask oxygen  Post-op Assessment: Report given to RN and Post -op Vital signs reviewed and stable  Post vital signs: Reviewed and stable  Last Vitals:  Vitals Value Taken Time  BP    Temp    Pulse 90 09/02/20 2103  Resp 20 09/02/20 2103  SpO2 100 % 09/02/20 2103  Vitals shown include unvalidated device data.  Last Pain:  Vitals:   09/02/20 1816  TempSrc: Oral  PainSc:          Complications: No complications documented.

## 2020-09-02 NOTE — Sepsis Progress Note (Signed)
Appropriate blood cultures and antibiotics given. Patient taken to OR for definitive treatment of sepsis source. Recommend trending lactic acid levels post-op if appropriate. Will close sepsis monitoring at this time.

## 2020-09-02 NOTE — Progress Notes (Addendum)
Notified provider and bedside nurse of need to order lactic acid and blood cultures.  No Lactic acid ordered and only a single blood culture ordered. Message sent to provider and bedside RN asking for provider to consider adding these labs.   Bedside Rn also confirmed abx were started prior to blood cultures drawn due to abx was ordered prior to code sepsis called

## 2020-09-02 NOTE — ED Notes (Signed)
Patient transported to CT 

## 2020-09-02 NOTE — Op Note (Signed)
  Procedure Date:  09/02/2020  Pre-operative Diagnosis:  Acute appendicitis  Post-operative Diagnosis:  Perforated appendicitis  Procedure:  Laparoscopic appendectomy  Surgeon:  Melvyn Neth, MD  Assistant:  Signa Kell, PA-S  Anesthesia:  General endotracheal  Estimated Blood Loss:  10 ml  Specimens:  appendix  Complications:  None  Indications for Procedure:  This is a 75 y.o. female who presents with abdominal pain and workup revealing acute appendicitis.  The options of surgery versus observation were reviewed with the patient and/or family. The risks of bleeding, infection, recurrence of symptoms, negative laparoscopy, potential for an open procedure, bowel injury, abscess or infection, were all discussed with the patient and she was willing to proceed.  Description of Procedure: The patient was correctly identified in the preoperative area and brought into the operating room.  The patient was placed supine with VTE prophylaxis in place.  Appropriate time-outs were performed.  Anesthesia was induced and the patient was intubated.  Foley catheter was placed.  Appropriate antibiotics were infused.  The abdomen was prepped and draped in a sterile fashion. An infraumbilical incision was made. A cutdown technique was used to enter the abdominal cavity without injury, and a Hasson trocar was inserted.  Pneumoperitoneum was obtained with appropriate opening pressures.  Two 5-mm ports were placed in the suprapubic and left lateral positions under direct visualization.  The right lower quadrant was inspected and the appendix was identified and found to be acutely inflamed distally, with an area of perforation.  There was significant inflammatory response in the pelvis with adhesions to the left fallopian tube, sigmoid colon, and the omentum.  All these were taken down bluntly and with LigaSure.  The appendix was carefully dissected.  The mesoappendix was divided using the LigaSure.  The  base of the appendix was dissected out and divided with a standard load Endo GIA.  The appendix was placed in an Endocatch bag.  The right lower quadrant was then inspected again revealing an intact staple line, no bleeding, and no bowel injury.  The area was thoroughly irrigated.  A 19 Fr. Blake drain was inserted via the LLQ port site and placed into the pelvis and RLQ.    The 5 mm ports were removed under direct visualization and the Hasson trocar was removed.  The Endocatch bag was brought out through the umbilical incision.  The fascial opening was closed using 0 vicryl suture.  Local anesthetic was infused in all incisions and the incisions were closed with 4-0 Monocryl.  The drain was secured using 3-0 Nylon.  The wounds were cleaned and sealed with DermaBond.  The drain site was dressed with 4x4 gauze and TegaDerm.  Foley catheter was removed and the patient was emerged from anesthesia and extubated and brought to the recovery room for further management.  The patient tolerated the procedure well and all counts were correct at the end of the case.   Melvyn Neth, MD

## 2020-09-02 NOTE — Anesthesia Procedure Notes (Signed)
Procedure Name: Intubation Date/Time: 09/02/2020 7:12 PM Performed by: Lia Foyer, CRNA Pre-anesthesia Checklist: Patient identified, Emergency Drugs available, Suction available and Patient being monitored Patient Re-evaluated:Patient Re-evaluated prior to induction Oxygen Delivery Method: Circle system utilized Preoxygenation: Pre-oxygenation with 100% oxygen Induction Type: IV induction Ventilation: Mask ventilation without difficulty Laryngoscope Size: McGraph and 3 Grade View: Grade I Tube type: Oral Tube size: 7.0 mm Number of attempts: 1 Airway Equipment and Method: Stylet and Video-laryngoscopy Placement Confirmation: ETT inserted through vocal cords under direct vision,  positive ETCO2 and breath sounds checked- equal and bilateral Secured at: 21 cm Tube secured with: Tape Dental Injury: Teeth and Oropharynx as per pre-operative assessment

## 2020-09-02 NOTE — Progress Notes (Signed)
elink following sepsis 

## 2020-09-02 NOTE — ED Notes (Signed)
Report given to OR staff.

## 2020-09-02 NOTE — ED Notes (Signed)
Pt transported to OR at this time by OR staff.  

## 2020-09-02 NOTE — Progress Notes (Signed)
CODE SEPSIS - PHARMACY COMMUNICATION  **Broad Spectrum Antibiotics should be administered within 1 hour of Sepsis diagnosis**  Time Code Sepsis Called/Page Received: 1452  Antibiotics Ordered: CTX, Flagyl  Time of 1st antibiotic administration: 9147  Additional action taken by pharmacy: none  If necessary, Name of Provider/Nurse Contacted: n/a    Pearla Dubonnet ,PharmD Clinical Pharmacist  09/02/2020  2:54 PM

## 2020-09-03 DIAGNOSIS — N183 Chronic kidney disease, stage 3 unspecified: Secondary | ICD-10-CM

## 2020-09-03 DIAGNOSIS — E785 Hyperlipidemia, unspecified: Secondary | ICD-10-CM

## 2020-09-03 DIAGNOSIS — E119 Type 2 diabetes mellitus without complications: Secondary | ICD-10-CM

## 2020-09-03 DIAGNOSIS — K3532 Acute appendicitis with perforation and localized peritonitis, without abscess: Principal | ICD-10-CM

## 2020-09-03 DIAGNOSIS — I10 Essential (primary) hypertension: Secondary | ICD-10-CM

## 2020-09-03 DIAGNOSIS — E1122 Type 2 diabetes mellitus with diabetic chronic kidney disease: Secondary | ICD-10-CM

## 2020-09-03 LAB — BASIC METABOLIC PANEL
Anion gap: 12 (ref 5–15)
BUN: 20 mg/dL (ref 8–23)
CO2: 24 mmol/L (ref 22–32)
Calcium: 8.7 mg/dL — ABNORMAL LOW (ref 8.9–10.3)
Chloride: 101 mmol/L (ref 98–111)
Creatinine, Ser: 1.16 mg/dL — ABNORMAL HIGH (ref 0.44–1.00)
GFR, Estimated: 49 mL/min — ABNORMAL LOW (ref >60)
Glucose, Bld: 242 mg/dL — ABNORMAL HIGH (ref 70–99)
Potassium: 4.5 mmol/L (ref 3.5–5.1)
Sodium: 137 mmol/L (ref 135–145)

## 2020-09-03 LAB — CBC
HCT: 37 % (ref 36.0–46.0)
Hemoglobin: 12.1 g/dL (ref 12.0–15.0)
MCH: 29.7 pg (ref 26.0–34.0)
MCHC: 32.7 g/dL (ref 30.0–36.0)
MCV: 90.7 fL (ref 80.0–100.0)
Platelets: 363 10*3/uL (ref 150–400)
RBC: 4.08 MIL/uL (ref 3.87–5.11)
RDW: 13.6 % (ref 11.5–15.5)
WBC: 17.4 10*3/uL — ABNORMAL HIGH (ref 4.0–10.5)
nRBC: 0 % (ref 0.0–0.2)

## 2020-09-03 LAB — GLUCOSE, CAPILLARY
Glucose-Capillary: 245 mg/dL — ABNORMAL HIGH (ref 70–99)
Glucose-Capillary: 202 mg/dL — ABNORMAL HIGH (ref 70–99)
Glucose-Capillary: 134 mg/dL — ABNORMAL HIGH (ref 70–99)
Glucose-Capillary: 128 mg/dL — ABNORMAL HIGH (ref 70–99)

## 2020-09-03 LAB — HEMOGLOBIN A1C
Hgb A1c MFr Bld: 7 % — ABNORMAL HIGH (ref 4.8–5.6)
Mean Plasma Glucose: 154.2 mg/dL

## 2020-09-03 LAB — MAGNESIUM: Magnesium: 2.2 mg/dL (ref 1.7–2.4)

## 2020-09-03 MED ORDER — INSULIN ASPART 100 UNIT/ML ~~LOC~~ SOLN
8.0000 [IU] | Freq: Three times a day (TID) | SUBCUTANEOUS | Status: DC
  Administered 2020-09-03: 8 [IU] via SUBCUTANEOUS
  Filled 2020-09-03 (×2): qty 1

## 2020-09-03 MED ORDER — INSULIN ASPART 100 UNIT/ML ~~LOC~~ SOLN
5.0000 [IU] | Freq: Two times a day (BID) | SUBCUTANEOUS | Status: DC
  Administered 2020-09-03: 5 [IU] via SUBCUTANEOUS
  Filled 2020-09-03: qty 1

## 2020-09-03 MED ORDER — SODIUM CHLORIDE 0.9 % IV SOLN: INTRAVENOUS | Status: DC

## 2020-09-03 MED ORDER — ENOXAPARIN SODIUM 40 MG/0.4ML ~~LOC~~ SOLN
40.0000 mg | SUBCUTANEOUS | Status: DC
  Administered 2020-09-03: 40 mg via SUBCUTANEOUS
  Filled 2020-09-03: qty 0.4

## 2020-09-03 NOTE — Consult Note (Signed)
[facility] Medical Consultation  Alexandra Novak GGY:[id number] DOB: Oct 10, 1945 DOA: 09/02/2020 PCP: Baxter Hire, MD   Requesting physician: Dr. Olean Ree Date of consultation: 09/02/2020 Reason for consultation: Medical management of chronic medical issues  Impression/Recommendations  1. Perforated appendicitis s/p laparoscopic appendectomy POD 0 Patient tolerated procedure well.  Continue management per primary surgical team.  2.  Insulin-dependent type 2 diabetes, controlled HbA1C of 7  Home regimen : 80U of Lantus qHS, 20U humalog w/lunch, 30U Humalog w/dinner - Start with 5U Novolog w/lunch and dinner, resistant sliding scale while on clear diet. Will add on night time insulin tomorrow depending on her BG readings   3.  Hypertension Continue home amlodipine, HCTZ, metoprolol and lisinopril  4.  CKD stage IIIb -Admit creatinine of 1.18, unclear baseline with no prior documented Cr - follow BMP while she is receiving IV fluids  5.  Hyperlipidemia Continue statin   6.  Morbid obesity BMI of greater than 40  [facility] hospitalist will followup again tomorrow. Please contact me if I can be of assistance in the meanwhile. Thank you for this consultation.  Chief Complaint: abdominal pain  HPI:  Alexandra Novak is a 75 yo female with hx of insulin-dependent type 2 diabetes, hypertension, CKD stage IIIb, morbid obesity and hyperlipidemia who presents with abdominal pain.  She reports about a 4 to 5-day history of right lower quadrant abdominal pain that progressively worsened.  She denies any nausea, vomiting or diarrhea.  Patient then went to a walk-in clinic and was noted to have a significant leukocytosis of 20.6 and was referred to the ED for further evaluation.  Abdomen shows acute pancreatitis and she was admitted by surgery for laparoscopic appendectomy and was found to have a perforated appendix. Rated the procedure well and hospitalist is being consulted to manage  chronic medical problems   Review of Systems: Constitutional: No Weight Change, No Fever ENT/Mouth: No sore throat, No Rhinorrhea Eyes: No Eye Pain, No Vision Changes Cardiovascular: No Chest Pain, no SOB Respiratory: No Cough, No Sputum, No Wheezing, no Dyspnea  Gastrointestinal: No Nausea, No Vomiting, No Diarrhea, No Constipation, + Pain Genitourinary: no Urinary Incontinence Musculoskeletal: No Arthralgias, No Myalgias Skin: No Skin Lesions, No Pruritus, Neuro: no Weakness, No Numbness Psych: No Anxiety/Panic, No Depression, + decrease appetite Heme/Lymph: No Bruising, No Bleeding  Past Medical History:  Diagnosis Date  . Basal cell carcinoma 01/22/2019   Left upper arm. Nodular pattern. Tx: EDC  . Diabetes mellitus without complication ([place])    History reviewed. No pertinent surgical history. Social History:  reports that she has never smoked. She has never used smokeless tobacco. No history on file for alcohol use and drug use.  No Known Allergies History reviewed. No pertinent family history.  Prior to Admission medications   Medication Sig Start Date End Date Taking? Authorizing Provider  ACCU-CHEK AVIVA PLUS test strip daily. as directed 07/14/19   [provider]  Accu-Chek Softclix Lancets lancets daily. 07/17/19   [provider]  amLODipine (NORVASC) 10 MG tablet TAKE 1 TABLET(10 MG) BY MOUTH EVERY DAY 08/18/19   [provider]  HUMALOG 100 UNIT/ML injection IN 20 UNITS UNDER THE SKIN BEFORE BREAKFAST AND 30 UNITS BEFORE SUPPER 08/04/19   [provider]  hydrochlorothiazide (HYDRODIURIL) 25 MG tablet Take 25 mg by mouth daily. 07/19/19   [provider]  insulin glargine (LANTUS) 100 UNIT/ML injection INJECT 80 UNIS UNDER THE SKIN EVERY NIGHT AT BEDTIME 09/06/19   [provider]  lisinopril (ZESTRIL) 20 MG tablet Take 20 mg by mouth 2 (two) times daily. 06/16/19   [provider]  metFORMIN (GLUCOPHAGE) 500 MG  tablet Take 500 mg by mouth daily. 07/22/19   [provider]  pravastatin (PRAVACHOL) 40 MG tablet TAKE 1 TABLET BY MOUTH NIGHTLY 08/09/19   [provider]   Physical Exam: Blood pressure 130/65, pulse (!) 106, temperature 98.2 F (36.8 C), temperature source Oral, resp. rate 18, height 5' 0.98" (1.549 m), weight 102.1 kg, SpO2 97 %. Vitals:   09/02/20 2254 09/03/20 0006  BP: (!) 135/57 130/65  Pulse: (!) 102 (!) 106  Resp: 16 18  Temp: 97.8 F (36.6 C) 98.2 F (36.8 C)  SpO2: 95% 97%   General: Nontoxic appearing female laying at 30 degree incline Eyes: PERRL, lids and conjunctivae normal ENMT: Mucous membranes are moist.  Neck: normal, supple Respiratory: clear to auscultation bilaterally, no wheezing, no crackles. Normal respiratory effort. No accessory muscle use.  Cardiovascular: Regular rate and rhythm, no murmurs / rubs / gallops.  No LE edema  Abdomen:  lower Umbilical surgical incision wound, lower abdomen JP drain in place Musculoskeletal: no clubbing / cyanosis. No joint deformity upper and lower extremities. Good ROM, no contractures. Normal muscle tone.  Skin: no rashes, lesions, ulcers. No induration Neurologic:  CN 2-12 grossly intact. Sensation intact,  Strength 5/5 in all 4.  Psychiatric:  Normal mood.  Normal judgment.  Labs on Admission:  Basic Metabolic Panel: Recent Labs  Lab 09/02/20 1200  NA 134*  K 3.6  CL 98  CO2 23  GLUCOSE 185*  BUN 29*  CREATININE 1.18*  CALCIUM 9.0  MG 2.2   Liver Function Tests: Recent Labs  Lab 09/02/20 1200  AST 17  ALT 14  ALKPHOS 73  BILITOT 0.8  PROT 8.1  ALBUMIN 3.5   Recent Labs  Lab 09/02/20 1200  LIPASE 20   No results for input(s): AMMONIA in the last 168 hours. CBC: Recent Labs  Lab 09/02/20 1200  WBC 20.6*  NEUTROABS 13.7*  HGB 12.9  HCT 38.4  MCV 88.3  PLT 426*   Cardiac Enzymes: No results for input(s): CKTOTAL, CKMB, CKMBINDEX, TROPONINI in the last 168  hours. BNP: Invalid input(s): POCBNP CBG: Recent Labs  Lab 09/02/20 2102  GLUCAP 197*    Radiological Exams on Admission: CT ABDOMEN PELVIS W CONTRAST  Result Date: 09/02/2020 CLINICAL DATA:  Leukocytosis, lower abdominal pain, constipation. EXAM: CT ABDOMEN AND PELVIS WITH CONTRAST TECHNIQUE: Multidetector CT imaging of the abdomen and pelvis was performed using the standard protocol following bolus administration of intravenous contrast. CONTRAST:  76mL OMNIPAQUE IOHEXOL 300 MG/ML  SOLN COMPARISON:  Report from a abdominal ultrasound dated 08/20/2000. FINDINGS: Lower chest: Mild tree in bud opacities in the lingula are partially imaged. Hepatobiliary: A 1.3 cm hypoattenuating lesion is seen in hepatic segment 6 (series 2, image 30). No gallstones, gallbladder wall thickening, or biliary dilatation. Pancreas: Unremarkable. No pancreatic ductal dilatation or surrounding inflammatory changes. Spleen: Normal in size without focal abnormality. Adrenals/Urinary Tract: Adrenal glands are unremarkable. Bilateral renal cysts measure up to 3.3 cm on the right and 2.1 cm on the left. A nonobstructive left renal calculus measures 4 mm. No hydronephrosis on either side. Bladder is unremarkable. Stomach/Bowel: There is a small hiatal hernia. The right lower quadrant appendix is enlarged, measuring up to 1.1 cm in diameter, with associated inflammatory changes, consistent with acute appendicitis. No well-defined fluid collection to suggest abscess is identified.  Colonic diverticula are noted. Inflammatory changes involve portions of the sigmoid colon, however this is favored to reflect reactive changes from appendicitis as opposed to diverticulitis as the etiology. No evidence of bowel obstruction or fistula formation. Vascular/Lymphatic: Aortic atherosclerosis. No enlarged abdominal or pelvic lymph nodes. Reproductive: Uterus and bilateral adnexa are unremarkable. Other: Small fat containing midline ventral  abdominal wall hernias are noted. No free intraperitoneal air. Musculoskeletal: 5 mm anterolisthesis of L4 on L5 is likely chronic. Degenerative changes are seen in the spine. IMPRESSION: 1. Acute appendicitis. No well-defined fluid collection to suggest abscess. 2. Inflammatory changes involve portions of the sigmoid colon, however this is favored to reflect reactive changes from appendicitis as opposed to diverticulitis as the etiology. 3. Mild tree in bud opacities in the lingula are partially imaged, but may reflect an infectious or inflammatory process. 4. Indeterminate 1.3 cm hypoattenuating lesion in hepatic segment 6. Recommend nonemergent MRI of the abdomen without and with contrast for further characterization. Aortic Atherosclerosis (ICD10-I70.0). Electronically Signed   By: Zerita Boers M.D.   On: 09/02/2020 14:02     Time spent: Greater than 45 minutes was spent reviewing documentation, lab, imaging, evaluation of patient at bedside and coordination of care.  Orene Desanctis [facility]   If 7PM-7AM, please contact night-coverage [email or web address]  09/03/2020, 1:43 AM

## 2020-09-03 NOTE — Anesthesia Postprocedure Evaluation (Signed)
Anesthesia Post Note  Patient: Alexandra Novak  Procedure(s) Performed: APPENDECTOMY LAPAROSCOPIC (N/A )  Patient location during evaluation: PACU Anesthesia Type: General Level of consciousness: awake and alert and oriented Pain management: pain level controlled Vital Signs Assessment: post-procedure vital signs reviewed and stable Respiratory status: spontaneous breathing Cardiovascular status: blood pressure returned to baseline Anesthetic complications: no   No complications documented.   Last Vitals:  Vitals:   09/03/20 0006 09/03/20 0434  BP: 130/65 (!) 155/63  Pulse: (!) 106 94  Resp: 18 16  Temp: 36.8 C 36.8 C  SpO2: 97% 94%    Last Pain:  Vitals:   09/03/20 0434  TempSrc: Oral  PainSc:                  Najah Liverman

## 2020-09-03 NOTE — Progress Notes (Signed)
1        [facility] [place] NAME: Alexandra Novak    MR#:  [id number]  DATE OF BIRTH:  Mar 29, 1946  SUBJECTIVE:  CHIEF COMPLAINT:   Chief Complaint  Patient presents with  . Abdominal Pain  In good spirit.  Denies any new complaints.  Passing flatus.  Tolerating clear liquids.  Daughter at bedside REVIEW OF SYSTEMS:  Review of Systems  Constitutional: Negative for diaphoresis, fever, malaise/fatigue and weight loss.  HENT: Negative for ear discharge, ear pain, hearing loss, nosebleeds, sore throat and tinnitus.   Eyes: Negative for blurred vision and pain.  Respiratory: Negative for cough, hemoptysis, shortness of breath and wheezing.   Cardiovascular: Negative for chest pain, palpitations, orthopnea and leg swelling.  Gastrointestinal: Positive for abdominal pain and nausea. Negative for blood in stool, constipation, diarrhea, heartburn and vomiting.  Genitourinary: Negative for dysuria, frequency and urgency.  Musculoskeletal: Negative for back pain and myalgias.  Skin: Negative for itching and rash.  Neurological: Negative for dizziness, tingling, tremors, focal weakness, seizures, weakness and headaches.  Psychiatric/Behavioral: Negative for depression. The patient is not nervous/anxious.    DRUG ALLERGIES:  No Known Allergies VITALS:  Blood pressure (!) 142/68, pulse 100, temperature 98.7 F (37.1 C), temperature source Oral, resp. rate 18, height 5' 0.98" (1.549 m), weight 102.1 kg, SpO2 96 %. PHYSICAL EXAMINATION:  Physical Exam  75 year old female lying in the bed comfortably without any acute distress Eyes pupils equal round reactive to light and accommodation Lungs clear to auscultation bilaterally no wheezing rales rhonchi or crepitation Cardiovascular S1-S2 normal no murmur rales or gallop Abdomen soft nondistended minimal incisional tenderness no signs of infection.  Left lower quadrant drain in place draining serosanguineous fluid Neuro  alert and oriented, nonfocal exam Skin no rash or lesion LABORATORY PANEL:  Female CBC Recent Labs  Lab 09/03/20 0427  WBC 17.4*  HGB 12.1  HCT 37.0  PLT 363   ------------------------------------------------------------------------------------------------------------------ Chemistries  Recent Labs  Lab 09/02/20 1200 09/03/20 0427  NA 134* 137  K 3.6 4.5  CL 98 101  CO2 23 24  GLUCOSE 185* 242*  BUN 29* 20  CREATININE 1.18* 1.16*  CALCIUM 9.0 8.7*  MG 2.2 2.2  AST 17  --   ALT 14  --   ALKPHOS 73  --   BILITOT 0.8  --    RADIOLOGY:  No results found. ASSESSMENT AND PLAN:  75 year old female status post laparoscopic appendectomy  Perforated appendicitis s/p laparoscopic appendectomy POD 1.  Tolerated clear liquids.  Advancing to full liquid today per surgery Patient tolerated procedure well.  Continue management per primary surgical team.  2.  Insulin-dependent type 2 diabetes, controlled HbA1C of 7  Home regimen : 80U of Lantus qHS, 20U humalog w/lunch, 30U Humalog w/dinner -Increase to 8U Novolog w/ each meal, resistant sliding scale while on clear diet.  3.  Hypertension Continue home amlodipine, HCTZ, metoprolol and lisinopril  4.  CKD stage IIIb -Admit creatinine of 1.18-> 1.16, unclear baseline with no prior documented Cr -Improving with IV fluids  5.  Hyperlipidemia Continue statin   6.  Morbid obesity BMI of greater than 40 Body mass index is 42.54 kg/m.  Net IO Since Admission: 1,783.09 mL [09/03/20 1422]       DVT prophylaxis:       enoxaparin (LOVENOX) injection 40 mg Start: 09/03/20 2000 SCDs Start: 09/03/20 1234 SCDs Start: 09/02/20 1821     Family Communication: Updated daughter at bedside  All the records are reviewed and case discussed with Care Management/Social Worker. Management plans discussed with the patient, family and they are in agreement.  CODE STATUS: Full Code Level of care: Med-Surg  TOTAL TIME TAKING  CARE OF THIS PATIENT: 20 minutes.   More than 50% of the time was spent in counseling/coordination of care: YES  Max Sane M.D on 09/03/2020 at 2:22 PM  [facility]   CC: Primary care physician; Baxter Hire, MD  Note: This dictation was prepared with Dragon dictation along with smaller phrase technology. Any transcriptional errors that result from this process are unintentional.

## 2020-09-03 NOTE — Progress Notes (Signed)
09/03/2020  Subjective: Patient is 1 Day Post-Op status post laparoscopic appendectomy for perforated appendicitis.  No acute events overnight.  Patient reports that she is having some flatus.  Denies nausea.  Hospitalist was consulted to help [place] with her diabetes and blood pressure management given that she is on multiple medications.  Her white blood cell count is improved to 17.4 today and her creatinine is stable at 1.16.  Unclear as to what her baseline is.  Vital signs: Temp:  [97.2 F (36.2 C)-98.7 F (37.1 C)] 98.7 F (37.1 C) (04/24 1149) Pulse Rate:  [84-106] 100 (04/24 1149) Resp:  [14-19] 18 (04/24 1149) BP: (122-155)/(57-96) 142/68 (04/24 1149) SpO2:  [93 %-100 %] 96 % (04/24 1149) Weight:  [102.1 kg] 102.1 kg (04/23 2206)   Intake/Output: 04/23 0701 - 04/24 0700 In: 1568.1 [P.O.:50; I.V.:1469.3; IV Piggyback:48.8] Out: 85 [Urine:35; Drains:40; Blood:10]    Physical Exam: Constitutional: No acute distress Abdomen: Soft, nondistended, appropriately tender to palpation.  Incisions are clean, dry, intact with Dermabond in place.  Left lower quadrant Blake drain with serosanguineous fluid.  Labs:  Recent Labs    09/02/20 1200 09/03/20 0427  WBC 20.6* 17.4*  HGB 12.9 12.1  HCT 38.4 37.0  PLT 426* 363   Recent Labs    09/02/20 1200 09/03/20 0427  NA 134* 137  K 3.6 4.5  CL 98 101  CO2 23 24  GLUCOSE 185* 242*  BUN 29* 20  CREATININE 1.18* 1.16*  CALCIUM 9.0 8.7*   No results for input(s): LABPROT, INR in the last 72 hours.  Imaging: CT ABDOMEN PELVIS W CONTRAST  Result Date: 09/02/2020 CLINICAL DATA:  Leukocytosis, lower abdominal pain, constipation. EXAM: CT ABDOMEN AND PELVIS WITH CONTRAST TECHNIQUE: Multidetector CT imaging of the abdomen and pelvis was performed using the standard protocol following bolus administration of intravenous contrast. CONTRAST:  66mL OMNIPAQUE IOHEXOL 300 MG/ML  SOLN COMPARISON:  Report from a abdominal ultrasound dated  08/20/2000. FINDINGS: Lower chest: Mild tree in bud opacities in the lingula are partially imaged. Hepatobiliary: A 1.3 cm hypoattenuating lesion is seen in hepatic segment 6 (series 2, image 30). No gallstones, gallbladder wall thickening, or biliary dilatation. Pancreas: Unremarkable. No pancreatic ductal dilatation or surrounding inflammatory changes. Spleen: Normal in size without focal abnormality. Adrenals/Urinary Tract: Adrenal glands are unremarkable. Bilateral renal cysts measure up to 3.3 cm on the right and 2.1 cm on the left. A nonobstructive left renal calculus measures 4 mm. No hydronephrosis on either side. Bladder is unremarkable. Stomach/Bowel: There is a small hiatal hernia. The right lower quadrant appendix is enlarged, measuring up to 1.1 cm in diameter, with associated inflammatory changes, consistent with acute appendicitis. No well-defined fluid collection to suggest abscess is identified. Colonic diverticula are noted. Inflammatory changes involve portions of the sigmoid colon, however this is favored to reflect reactive changes from appendicitis as opposed to diverticulitis as the etiology. No evidence of bowel obstruction or fistula formation. Vascular/Lymphatic: Aortic atherosclerosis. No enlarged abdominal or pelvic lymph nodes. Reproductive: Uterus and bilateral adnexa are unremarkable. Other: Small fat containing midline ventral abdominal wall hernias are noted. No free intraperitoneal air. Musculoskeletal: 5 mm anterolisthesis of L4 on L5 is likely chronic. Degenerative changes are seen in the spine. IMPRESSION: 1. Acute appendicitis. No well-defined fluid collection to suggest abscess. 2. Inflammatory changes involve portions of the sigmoid colon, however this is favored to reflect reactive changes from appendicitis as opposed to diverticulitis as the etiology. 3. Mild tree in bud opacities in the  lingula are partially imaged, but may reflect an infectious or inflammatory process. 4.  Indeterminate 1.3 cm hypoattenuating lesion in hepatic segment 6. Recommend nonemergent MRI of the abdomen without and with contrast for further characterization. Aortic Atherosclerosis (ICD10-I70.0). Electronically Signed   By: Zerita Boers M.D.   On: 09/02/2020 14:02    Assessment/Plan: This is a 75 y.o. female s/p laparoscopic appendectomy.  - Patient is doing well this morning.  She tolerated clears last night and for breakfast.  We will advance her diet to full liquid diet today.  Continue IV antibiotics and will decrease her IV fluid rate.  Anticipate advancing her diet later on and possibly discharging her home tomorrow. - Repeat CBC in the morning.   Melvyn Neth, [facility]

## 2020-09-03 NOTE — Plan of Care (Signed)
  Problem: Clinical Measurements: Goal: Ability to maintain clinical measurements within normal limits will improve Outcome: Progressing Goal: Will remain free from infection Outcome: Progressing Goal: Diagnostic test results will improve Outcome: Progressing Goal: Respiratory complications will improve Outcome: Progressing Goal: Cardiovascular complication will be avoided Outcome: Progressing   Problem: Pain Managment: Goal: General experience of comfort will improve Outcome: Progressing   Patient is involved in and agrees with the plan of care. V/S stable. No complaints of pain at this time. Port sites dry and intact. Jp drain in place. Voiding well.

## 2020-09-04 ENCOUNTER — Encounter: Payer: Self-pay | Admitting: Surgery

## 2020-09-04 DIAGNOSIS — Z794 Long term (current) use of insulin: Secondary | ICD-10-CM

## 2020-09-04 DIAGNOSIS — E119 Type 2 diabetes mellitus without complications: Secondary | ICD-10-CM

## 2020-09-04 LAB — GLUCOSE, CAPILLARY
Glucose-Capillary: 165 mg/dL — ABNORMAL HIGH (ref 70–99)
Glucose-Capillary: 136 mg/dL — ABNORMAL HIGH (ref 70–99)
Glucose-Capillary: 132 mg/dL — ABNORMAL HIGH (ref 70–99)

## 2020-09-04 LAB — CBC
HCT: 37.3 % (ref 36.0–46.0)
Hemoglobin: 12 g/dL (ref 12.0–15.0)
MCH: 29.5 pg (ref 26.0–34.0)
MCHC: 32.2 g/dL (ref 30.0–36.0)
MCV: 91.6 fL (ref 80.0–100.0)
Platelets: 466 10*3/uL — ABNORMAL HIGH (ref 150–400)
RBC: 4.07 MIL/uL (ref 3.87–5.11)
RDW: 13.6 % (ref 11.5–15.5)
WBC: 15.8 10*3/uL — ABNORMAL HIGH (ref 4.0–10.5)
nRBC: 0 % (ref 0.0–0.2)

## 2020-09-04 LAB — BASIC METABOLIC PANEL
Anion gap: 10 (ref 5–15)
BUN: 14 mg/dL (ref 8–23)
CO2: 27 mmol/L (ref 22–32)
Calcium: 8.8 mg/dL — ABNORMAL LOW (ref 8.9–10.3)
Chloride: 103 mmol/L (ref 98–111)
Creatinine, Ser: 0.95 mg/dL (ref 0.44–1.00)
GFR, Estimated: >60 mL/min (ref >60)
Glucose, Bld: 134 mg/dL — ABNORMAL HIGH (ref 70–99)
Potassium: 3.8 mmol/L (ref 3.5–5.1)
Sodium: 140 mmol/L (ref 135–145)

## 2020-09-04 MED ORDER — AMOXICILLIN-POT CLAVULANATE 875-125 MG PO TABS: 1.0000 | ORAL_TABLET | Freq: Two times a day (BID) | ORAL | 0 refills | Status: AC

## 2020-09-04 MED ORDER — OXYCODONE HCL 5 MG PO TABS: 5.0000 mg | ORAL_TABLET | Freq: Four times a day (QID) | ORAL | 0 refills | Status: DC | PRN

## 2020-09-04 MED ORDER — IBUPROFEN 600 MG PO TABS: 600.0000 mg | ORAL_TABLET | Freq: Four times a day (QID) | ORAL | 0 refills | Status: DC | PRN

## 2020-09-04 NOTE — Progress Notes (Signed)
Patient educated on dressing changes, and provided drain cup marked clearly with sharpie for easier output measuring.

## 2020-09-04 NOTE — Progress Notes (Signed)
1        [facility] NAME: Kimberlie Csaszar    MR#:  [id number]  DATE OF BIRTH:  13-Mar-1946  SUBJECTIVE:  CHIEF COMPLAINT:   Chief Complaint  Patient presents with  . Abdominal Pain  No new complaints.  Ready to leave.  Appreciative of her care here REVIEW OF SYSTEMS:  Review of Systems  Constitutional: Negative for diaphoresis, fever, malaise/fatigue and weight loss.  HENT: Negative for ear discharge, ear pain, hearing loss, nosebleeds, sore throat and tinnitus.   Eyes: Negative for blurred vision and pain.  Respiratory: Negative for cough, hemoptysis, shortness of breath and wheezing.   Cardiovascular: Negative for chest pain, palpitations, orthopnea and leg swelling.  Gastrointestinal: Negative for blood in stool, constipation, diarrhea, heartburn and vomiting.  Genitourinary: Negative for dysuria, frequency and urgency.  Musculoskeletal: Negative for back pain and myalgias.  Skin: Negative for itching and rash.  Neurological: Negative for dizziness, tingling, tremors, focal weakness, seizures, weakness and headaches.  Psychiatric/Behavioral: Negative for depression. The patient is not nervous/anxious.    DRUG ALLERGIES:  No Known Allergies VITALS:  Blood pressure (!) 132/103, pulse 88, temperature 98.1 F (36.7 C), temperature source Oral, resp. rate 18, height 5' 0.98" (1.549 m), weight 102.1 kg, SpO2 98 %. PHYSICAL EXAMINATION:  Physical Exam  75 year old female lying in the bed comfortably without any acute distress Eyes pupils equal round reactive to light and accommodation Lungs clear to auscultation bilaterally no wheezing rales rhonchi or crepitation Cardiovascular S1-S2 normal no murmur rales or gallop Abdomen soft nondistended minimal incisional tenderness no signs of infection Neuro alert and oriented, nonfocal exam Skin no rash or lesion LABORATORY PANEL:  Female CBC Recent Labs  Lab 09/04/20 0354  WBC 15.8*  HGB 12.0  HCT  37.3  PLT 466*   ------------------------------------------------------------------------------------------------------------------ Chemistries  Recent Labs  Lab 09/02/20 1200 09/03/20 0427 09/04/20 0354  NA 134* 137 140  K 3.6 4.5 3.8  CL 98 101 103  CO2 23 24 27   GLUCOSE 185* 242* 134*  BUN 29* 20 14  CREATININE 1.18* 1.16* 0.95  CALCIUM 9.0 8.7* 8.8*  MG 2.2 2.2  --   AST 17  --   --   ALT 14  --   --   ALKPHOS 73  --   --   BILITOT 0.8  --   --    RADIOLOGY:  No results found. ASSESSMENT AND PLAN:  75 year old female status post laparoscopic appendectomy  Perforated appendicitis s/p laparoscopic appendectomy Tolerating diet.  Plan for discharge today per primary team.  2.  Insulin-dependent type 2 diabetes, controlled HbA1C of 7  Resume home regimen at discharge  3.  Hypertension Resume home blood pressure medicine at discharge  4.   AKI Resolved with hydration.  There is no CKD -> ruled out  5.  Hyperlipidemia Continue statin   6.  Morbid obesity BMI of greater than 40 Body mass index is 42.54 kg/m.  Net IO Since Admission: 1,525.25 mL [09/04/20 1132]       DVT prophylaxis:       enoxaparin (LOVENOX) injection 40 mg Start: 09/03/20 2000 SCDs Start: 09/03/20 1234 SCDs Start: 09/02/20 1821     Medically stable for discharge  All the records are reviewed and case discussed with Care Management/Social Worker. Management plans discussed with the patient, nursing and they are in agreement.  CODE STATUS: Full Code Level of care: Med-Surg  TOTAL TIME TAKING CARE OF THIS PATIENT:  20 minutes.   More than 50% of the time was spent in counseling/coordination of care: YES  Max Sane M.D on 09/04/2020 at 11:32 AM  [facility]   CC: Primary care physician; Baxter Hire, MD  Note: This dictation was prepared with Dragon dictation along with smaller phrase technology. Any transcriptional errors that result from this process are  unintentional.

## 2020-09-04 NOTE — Discharge Summary (Signed)
[facility] SURGICAL DISCHARGE SUMMARY  Patient ID: Alexandra Novak MRN: [id number] DOB/AGE: 76-13-1947 76 y.o.  Admit date: 09/02/2020 Discharge date: 09/04/2020  Discharge Diagnoses . Acute appendicitis 09/02/2020    Consultants None  Procedures 09/02/2020:  Laparoscopic Appendectomy  HPI: Alexandra Novak is a 75 y.o. female presenting with a 5 day history of abdominal pain.  The patient reports that pain started around the umbilicus first and then started moving to the right lower quadrant.  She did have some nausea early on, but no emesis.  She also reports feeling febrile but did not check her temperature.  At first she thought she may have diverticulitis and went to the walk-in clinic today.  Her WBC was 20.1 and was referred to the ER for further evaluation. In the ED, her workup showed also an elevated WBC of 20.6, Cr of 1.18.  She had a CT scan of abdomen/pelvis which showed acute appendicitis.  There's no free air or abscess, but on my read, the distal end of the appendix is very unclear, likely reflecting distal rupture.  She reports her pain right now is better compared to earlier.  Hospital Course: Informed consent was obtained and documented, and patient underwent uneventful laparoscopic appendectomy (Dr Hampton Abbot, 09/02/2020).  Post-operatively, patient's pain, symptoms, and leuckoytosis improved/resolved and advancement of patient's diet and ambulation were well-tolerated. The remainder of patient's hospital course was essentially unremarkable, and discharge planning was initiated accordingly with patient safely able to be discharged home with appropriate discharge instructions, antibiotics (Augmentin x7 days), pain control, and outpatient follow-up after all of her questions were answered to her expressed satisfaction.   Discharge Condition: Good   Physical Examination:  Constitutional: Well appearing female, NAD Pulmonary: Normal effort, no respiratory  distress Gastrointestinal: Soft, incisional soreness, non-distended, no rebound/guarding. Surgical drain in place; output serosanguinous Skin: Incisions are CDi, no erythema or drainage    Allergies as of 09/04/2020   No Known Allergies     Medication List    TAKE these medications   Accu-Chek Aviva Plus test strip Generic drug: glucose blood 1 each by Other route daily. as directed   Accu-Chek Softclix Lancets lancets 1 each by Other route daily.   amLODipine 10 MG tablet Commonly known as: NORVASC TAKE 1 TABLET(10 MG) BY MOUTH EVERY DAY   amoxicillin-clavulanate 875-125 MG tablet Commonly known as: Augmentin Take 1 tablet by mouth 2 (two) times daily for 7 days.   fexofenadine 180 MG tablet Commonly known as: ALLEGRA Take 180 mg by mouth daily.   HumaLOG 100 UNIT/ML injection Generic drug: insulin lispro IN 20 UNITS UNDER THE SKIN BEFORE BREAKFAST AND 30 UNITS BEFORE SUPPER   hydrochlorothiazide 25 MG tablet Commonly known as: HYDRODIURIL Take 25 mg by mouth daily.   ibuprofen 600 MG tablet Commonly known as: ADVIL Take 1 tablet (600 mg total) by mouth every 6 (six) hours as needed.   Lantus 100 UNIT/ML injection Generic drug: insulin glargine INJECT 80 UNIS UNDER THE SKIN EVERY NIGHT AT BEDTIME   lisinopril 20 MG tablet Commonly known as: ZESTRIL Take 20 mg by mouth 2 (two) times daily.   magnesium oxide 400 MG tablet Commonly known as: MAG-OX Take 400 mg by mouth daily.   metFORMIN 500 MG tablet Commonly known as: GLUCOPHAGE Take 500 mg by mouth daily.   metoprolol tartrate 100 MG tablet Commonly known as: LOPRESSOR Take 100 mg by mouth 2 (two) times daily.   omeprazole 20 MG capsule Commonly known as: PRILOSEC Take  20 mg by mouth daily.   oxyCODONE 5 MG immediate release tablet Commonly known as: Oxy IR/ROXICODONE Take 1 tablet (5 mg total) by mouth every 6 (six) hours as needed for severe pain or breakthrough pain.   pravastatin 40 MG  tablet Commonly known as: PRAVACHOL TAKE 1 TABLET BY MOUTH NIGHTLY         Follow-up Information    Olean Ree, MD. Schedule an appointment as soon as possible for a visit on 09/08/2020.   Specialty: General Surgery Why: s/p lap appy with drain Contact information: [address] [place] [address] [phone]                Time spent on discharge management including discussion of hospital course, clinical condition, outpatient instructions, prescriptions, and follow up with the patient and members of the medical team: >30 minutes  -- Edison Simon , PA-C [facility]  09/04/2020, 10:51 AM [phone] M-F: 7am - 4pm

## 2020-09-04 NOTE — Progress Notes (Addendum)
Alexandra Novak to be D/C'd home per MD order.  Discussed prescriptions and follow up appointments with the patient. Prescriptions given to patient, medication list explained in detail. Pt verbalized understanding.  Allergies as of 09/04/2020   No Known Allergies      Medication List     TAKE these medications    Accu-Chek Aviva Plus test strip Generic drug: glucose blood 1 each by Other route daily. as directed   Accu-Chek Softclix Lancets lancets 1 each by Other route daily.   amLODipine 10 MG tablet Commonly known as: NORVASC TAKE 1 TABLET(10 MG) BY MOUTH EVERY DAY   amoxicillin-clavulanate 875-125 MG tablet Commonly known as: Augmentin Take 1 tablet by mouth 2 (two) times daily for 7 days.   fexofenadine 180 MG tablet Commonly known as: ALLEGRA Take 180 mg by mouth daily.   HumaLOG 100 UNIT/ML injection Generic drug: insulin lispro IN 20 UNITS UNDER THE SKIN BEFORE BREAKFAST AND 30 UNITS BEFORE SUPPER   hydrochlorothiazide 25 MG tablet Commonly known as: HYDRODIURIL Take 25 mg by mouth daily.   ibuprofen 600 MG tablet Commonly known as: ADVIL Take 1 tablet (600 mg total) by mouth every 6 (six) hours as needed.   Lantus 100 UNIT/ML injection Generic drug: insulin glargine INJECT 80 UNIS UNDER THE SKIN EVERY NIGHT AT BEDTIME   lisinopril 20 MG tablet Commonly known as: ZESTRIL Take 20 mg by mouth 2 (two) times daily.   magnesium oxide 400 MG tablet Commonly known as: MAG-OX Take 400 mg by mouth daily.   metFORMIN 500 MG tablet Commonly known as: GLUCOPHAGE Take 500 mg by mouth daily.   metoprolol tartrate 100 MG tablet Commonly known as: LOPRESSOR Take 100 mg by mouth 2 (two) times daily.   omeprazole 20 MG capsule Commonly known as: PRILOSEC Take 20 mg by mouth daily.   oxyCODONE 5 MG immediate release tablet Commonly known as: Oxy IR/ROXICODONE Take 1 tablet (5 mg total) by mouth every 6 (six) hours as needed for severe pain or breakthrough  pain.   pravastatin 40 MG tablet Commonly known as: PRAVACHOL TAKE 1 TABLET BY MOUTH NIGHTLY        Vitals:   09/04/20 0743 09/04/20 1133  BP: (!) 132/103 (!) 126/58  Pulse: 88 88  Resp: 18 16  Temp: 98.1 F (36.7 C) 98.2 F (36.8 C)  SpO2: 98% 95%    Skin clean, dry and intact except for 2 sites with dermabond and 1 site that has the JP drain. Patient educated how to empty drain and how to change dressing over drain site. She was told to document how much she empties from JP drain. IV catheter discontinued intact. Site without signs and symptoms of complications. Dressing and pressure applied. Pt denies pain at this time. No complaints noted.  An After Visit Summary was printed and given to the patient. Patient escorted via [place], and D/C home via private auto.  [place] C. Deatra Ina

## 2020-09-04 NOTE — Discharge Instructions (Signed)
In addition to included general post-operative instructions,  Diet: Resume home diet.   Activity: No heavy lifting >20 pounds (children, pets, laundry, garbage) for 4 weeks, but light activity and walking are encouraged. Do not drive or drink alcohol if taking narcotic pain medications or having pain that might distract from driving.  Wound care: You may shower/get incision wet with soapy water and pat dry (do not rub incisions), but no baths or submerging incision underwater until follow-up.   Medications: Resume all home medications. For mild to moderate pain: acetaminophen (Tylenol) or ibuprofen/naproxen (if no kidney disease). Combining Tylenol with alcohol can substantially increase your risk of causing liver disease. Narcotic pain medications, if prescribed, can be used for severe pain, though may cause nausea, constipation, and drowsiness. Do not combine Tylenol and Percocet (or similar) within a 6 hour period as Percocet (and similar) contain(s) Tylenol. If you do not need the narcotic pain medication, you do not need to fill the prescription.  Call office [phone] / [phone]) at any time if any questions, worsening pain, fevers/chills, bleeding, drainage from incision site, or other concerns.

## 2020-09-05 ENCOUNTER — Other Ambulatory Visit: Payer: Self-pay | Admitting: Anatomic Pathology & Clinical Pathology

## 2020-09-06 LAB — SURGICAL PATHOLOGY

## 2020-09-07 ENCOUNTER — Other Ambulatory Visit: Payer: Medicare Other

## 2020-09-08 ENCOUNTER — Other Ambulatory Visit: Payer: Self-pay

## 2020-09-08 ENCOUNTER — Ambulatory Visit (INDEPENDENT_AMBULATORY_CARE_PROVIDER_SITE_OTHER): Payer: Medicare Other | Admitting: Surgery

## 2020-09-08 ENCOUNTER — Encounter: Payer: Self-pay | Admitting: Surgery

## 2020-09-08 VITALS — BP 133/67 | HR 102 | Temp 98.0°F | Ht 61.0 in | Wt 214.4 lb

## 2020-09-08 DIAGNOSIS — R160 Hepatomegaly, not elsewhere classified: Secondary | ICD-10-CM

## 2020-09-08 DIAGNOSIS — C181 Malignant neoplasm of appendix: Secondary | ICD-10-CM

## 2020-09-08 DIAGNOSIS — K769 Liver disease, unspecified: Secondary | ICD-10-CM

## 2020-09-08 DIAGNOSIS — Z09 Encounter for follow-up examination after completed treatment for conditions other than malignant neoplasm: Secondary | ICD-10-CM

## 2020-09-08 NOTE — Patient Instructions (Addendum)
We have sent a referral to Oncology to be seen. They will contact you in the next few days. If you do not hear from them call us.    We have sent a referral to Gastroenterology. They will call you to schedule this appointment.   You may change your dressing tomorrow and just keep a Band-Aid over the area until it closes.   We will get you scheduled for an MRI and a CT scan. Call us to let us know what your schedule is and we will schedule accordingly.  Once your MRI is scheduled we can send you in a prescription for sedation for this.

## 2020-09-08 NOTE — Progress Notes (Signed)
Tumor Board Documentation  Alexandra Novak was presented by Dr Hampton Abbot at our Tumor Board on 09/07/2020, which included representatives from medical oncology,radiation oncology,surgical oncology,internal medicine,navigation,pathology,radiology,surgical,genetics,research,palliative care,pulmonology.  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  Additionally, we reviewed previous medical and familial history, history of present illness, and recent lab results along with all available histopathologic and imaging studies. The tumor board considered available treatment options and made the following recommendations: Surgery,Additional screening (CT Chest and Abbdomen, Hemicolectomy) Refer to Medical Oncology, Colonoscopy, possible MRI  The following procedures/referrals were also placed: No orders of the defined types were placed in this encounter.   Clinical Trial Status: not discussed   Staging used: To be determined  AJCC Staging: T: 3 N: 0   Group: Goblet Cell Adenocarcinoma of Appendix   National site-specific guidelines NCCN were discussed with respect to the case.  Tumor board is a meeting of clinicians from various specialty areas who evaluate and discuss patients for whom a multidisciplinary approach is being considered. Final determinations in the plan of care are those of the provider(s). The responsibility for follow up of recommendations given during tumor board is that of the provider.   Today's extended care, comprehensive team conference, Alexandra Novak was not present for the discussion and was not examined.   Multidisciplinary Tumor Board is a multidisciplinary case peer review process.  Decisions discussed in the Multidisciplinary Tumor Board reflect the opinions of the specialists present at the conference without having examined the patient.  Ultimately, treatment and  diagnostic decisions rest with the primary provider(s) and the patient.

## 2020-09-09 LAB — CULTURE, BLOOD (SINGLE)
Culture: NO GROWTH
Special Requests: ADEQUATE

## 2020-09-10 ENCOUNTER — Encounter: Payer: Self-pay | Admitting: Surgery

## 2020-09-10 NOTE — Progress Notes (Signed)
09/10/2020  HPI: Alexandra Novak is a 75 y.o. female s/p laparoscopic appendectomy for perforated appendicitis on 09/02/20.  The distal portion of the appendix had a small perforation with inflammatory response, but no purulent fluid or spillage.  Blake drain left in place, and was discharged on antibiotic course.    Patient reports she's doing well, with some discomfort at the incisions, but no significant issues.  Drain has had low output over the last couple days.  Her pathology results came back this week and unfortunately, it shows goblet cell adenocarcinoma, T3N0 disease.  The resection margin was clear at the staple line, but the margin was only 6 mm.  On her preop CT scan, she was found to have a 1.3 cm right hepatic lobe lesion.  Her case was discussed during Multidisciplinary Tumor Board, and it was agreed that we needed to pursue further workup with CT chest to complete staging, MRI abdomen to evaluate the liver mass for potential malignancy, and colonoscopy to evaluate the rest of her colon for synchronous lesions.  She has never had a colonoscopy and reports that a friend had a major complication after colonoscopy.  Vital signs: BP 133/67   Pulse (!) 102   Temp 98 F (36.7 C) (Oral)   Ht 5\' 1"  (1.549 m)   Wt 214 lb 6.4 oz (97.3 kg)   SpO2 97%   BMI 40.51 kg/m    Physical Exam: Constitutional: No acute distress Abdomen:  Soft, non-distended, appropriately sore to palpation.  Incisions clean, dry, intact with dermabond in place and mild ecchymosis.  Drain with serous fluid, removed at bedside without complications, and 4x4 gauze dressing applied.  Assessment/Plan: This is a 75 y.o. female s/p laparoscopic appendectomy with pathology showing goblet cell adenocarcinoma.  --Discussed with the patient the pathology findings.  This was a lot of information to her.  She did not seem to be eager to proceed with surgery and potentially need chemotherapy, etc based on final findings, but  I discussed with her that prior to making any decisions about treatment, it would be best to complete the diagnostic workup in order for [place] to have all the information necessary to make a thoughtful, educated decision.  Discussed at the end, if she does not with to have any further surgery or treatment, we would abide by her decision. --As part of the diagnostic and workup process, discussed with her that we would order CT chest, MRI abdomen to evaluate the liver mass, eventual colonoscopy in about 6 weeks, and also refer her to our oncology team for further discussion and evaluation.  Patient is in agreement with this plan and also wants to have all the information needed to make a final decision.   --Discussed with her that we would follow up after the CT and MRI are done so we can talk more about the results, but she can call us or set up an appointment any time so we can talk further about her new diagnosis.  I understand that this was a lot of information for what should have been a regular post-op visit, and I am available to answer any questions she may have. --She will call us back this week to let us know her work schedule so we can schedule the imaging studies.   Melvyn Neth, [facility]

## 2020-09-12 ENCOUNTER — Encounter: Payer: Self-pay | Admitting: *Deleted

## 2020-09-12 ENCOUNTER — Telehealth: Payer: Self-pay | Admitting: Surgery

## 2020-09-12 NOTE — Telephone Encounter (Signed)
Spoke with centralized scheduling. Pt's CT is scheduled for 05/25 @ [place] at 9:45 am (NPO 4 hours prior and pick up contrast). Pt's MRI is scheduled for 05/25 @ [place] at 11 am.  Pt notified with dates and instructions. Verbalizes understanding.

## 2020-09-12 NOTE — Telephone Encounter (Signed)
Patient calls and she wants to proceed with doing the CT chest and MRI abdomen.  Wants to go ahead and get these scheduled, but she requested that they don't be scheduled until next week.  Please call her when scheduled, thank you.

## 2020-09-22 ENCOUNTER — Telehealth: Payer: Self-pay | Admitting: Gastroenterology

## 2020-09-22 NOTE — Telephone Encounter (Signed)
Patient called and asked that the 09/26/20 appointment  Be canceled, she stated that she would call back to reschedule the appointment.  I canceled the appointment per patient request. She stated that she did not make this appointment.

## 2020-09-25 ENCOUNTER — Telehealth: Payer: Self-pay | Admitting: Surgery

## 2020-09-25 NOTE — Telephone Encounter (Signed)
Patient is calling said she had spoken to someone about her ct and mri appointment but was needing to pick up contrast but was not sure where she needed to pick this up at. Please call patient and advise.

## 2020-09-25 NOTE — Telephone Encounter (Signed)
Spoke with the patient and let her know she does not have a prep kit to pick up. This is not needed for CT of the chest or the MRI of the abdomen. She is aware of arrival time and instructions.

## 2020-09-26 ENCOUNTER — Telehealth: Payer: Medicare Other

## 2020-10-04 ENCOUNTER — Ambulatory Visit
Admission: RE | Admit: 2020-10-04 | Discharge: 2020-10-04 | Disposition: A | Discharge status: Home / Self Care | Payer: Medicare Other | Source: Ambulatory Visit | Attending: Surgery | Admitting: Surgery

## 2020-10-04 ENCOUNTER — Other Ambulatory Visit: Payer: Self-pay

## 2020-10-04 DIAGNOSIS — C181 Malignant neoplasm of appendix: Secondary | ICD-10-CM | POA: Insufficient documentation

## 2020-10-04 DIAGNOSIS — R160 Hepatomegaly, not elsewhere classified: Secondary | ICD-10-CM | POA: Insufficient documentation

## 2020-10-04 MED ORDER — GADOBUTROL 1 MMOL/ML IV SOLN
10.0000 mL | Freq: Once | INTRAVENOUS | Status: AC | PRN
  Administered 2020-10-04: 10 mL via INTRAVENOUS

## 2020-10-25 ENCOUNTER — Telehealth (INDEPENDENT_AMBULATORY_CARE_PROVIDER_SITE_OTHER): Payer: Self-pay | Admitting: Gastroenterology

## 2020-10-25 DIAGNOSIS — C181 Malignant neoplasm of appendix: Secondary | ICD-10-CM

## 2020-10-25 MED ORDER — NA SULFATE-K SULFATE-MG SULF 17.5-3.13-1.6 GM/177ML PO SOLN: 1.0000 | Freq: Once | ORAL | 0 refills | Status: AC

## 2020-10-25 NOTE — Progress Notes (Signed)
Gastroenterology Pre-Procedure Review  Request Date: 11/07/20 Requesting Physician: Dr. Allen Norris  PATIENT REVIEW QUESTIONS: The patient responded to the following health history questions as indicated:    1. Are you having any GI issues? no 2. Do you have a personal history of Polyps? no 3. Do you have a family history of Colon Cancer or Polyps? no 4. Diabetes Mellitus? Yes, Type II 5. Joint replacements in the past 12 months?no 6. Major health problems in the past 3 months?no 7. Any artificial heart valves, MVP, or defibrillator?no    MEDICATIONS & ALLERGIES:    Patient reports the following regarding taking any anticoagulation/antiplatelet therapy:   Plavix, Coumadin, Eliquis, Xarelto, Lovenox, Pradaxa, Brilinta, or Effient? no Aspirin? no  Patient confirms/reports the following medications:  Current Outpatient Medications  Medication Sig Dispense Refill   ACCU-CHEK AVIVA PLUS test strip 1 each by Other route daily. as directed     Accu-Chek Softclix Lancets lancets 1 each by Other route daily.     amLODipine (NORVASC) 10 MG tablet TAKE 1 TABLET(10 MG) BY MOUTH EVERY DAY     fexofenadine (ALLEGRA) 180 MG tablet Take 180 mg by mouth daily.     HUMALOG 100 UNIT/ML injection IN 20 UNITS UNDER THE SKIN BEFORE BREAKFAST AND 30 UNITS BEFORE SUPPER     hydrochlorothiazide (HYDRODIURIL) 25 MG tablet Take 25 mg by mouth daily.     insulin glargine (LANTUS) 100 UNIT/ML injection INJECT 80 UNIS UNDER THE SKIN EVERY NIGHT AT BEDTIME     lisinopril (ZESTRIL) 20 MG tablet Take 20 mg by mouth 2 (two) times daily.     magnesium oxide (MAG-OX) 400 MG tablet Take 400 mg by mouth daily.     metFORMIN (GLUCOPHAGE) 500 MG tablet Take 500 mg by mouth daily.     metoprolol tartrate (LOPRESSOR) 100 MG tablet Take 100 mg by mouth 2 (two) times daily.     omeprazole (PRILOSEC) 20 MG capsule Take 20 mg by mouth daily.     pravastatin (PRAVACHOL) 40 MG tablet TAKE 1 TABLET BY MOUTH NIGHTLY     No current  facility-administered medications for this visit.    Patient confirms/reports the following allergies:  No Known Allergies  No orders of the defined types were placed in this encounter.   AUTHORIZATION INFORMATION Primary Insurance: 1D#: Group #:  Secondary Insurance: 1D#: Group #:  SCHEDULE INFORMATION: Date: 11/07/20 Time: Location: [place]

## 2020-11-03 ENCOUNTER — Ambulatory Visit: Payer: Medicare Other | Admitting: Surgery

## 2020-11-06 ENCOUNTER — Encounter: Payer: Self-pay | Admitting: Gastroenterology

## 2020-11-07 ENCOUNTER — Ambulatory Visit
Admission: RE | Admit: 2020-11-07 | Discharge: 2020-11-07 | Disposition: A | Discharge status: Home / Self Care | Payer: Medicare Other | Attending: Gastroenterology | Admitting: Gastroenterology

## 2020-11-07 ENCOUNTER — Ambulatory Visit: Payer: Medicare Other | Admitting: Certified Registered"

## 2020-11-07 ENCOUNTER — Encounter: Payer: Self-pay | Admitting: Gastroenterology

## 2020-11-07 ENCOUNTER — Other Ambulatory Visit: Payer: Self-pay

## 2020-11-07 ENCOUNTER — Encounter
Admission: RE | Disposition: A | Discharge status: Home / Self Care | Payer: Self-pay | Source: Home / Self Care | Attending: Gastroenterology

## 2020-11-07 DIAGNOSIS — Z79899 Other long term (current) drug therapy: Secondary | ICD-10-CM | POA: Diagnosis not present

## 2020-11-07 DIAGNOSIS — C181 Malignant neoplasm of appendix: Secondary | ICD-10-CM

## 2020-11-07 DIAGNOSIS — Z1211 Encounter for screening for malignant neoplasm of colon: Secondary | ICD-10-CM | POA: Diagnosis present

## 2020-11-07 DIAGNOSIS — D125 Benign neoplasm of sigmoid colon: Secondary | ICD-10-CM | POA: Diagnosis not present

## 2020-11-07 DIAGNOSIS — Z794 Long term (current) use of insulin: Secondary | ICD-10-CM | POA: Insufficient documentation

## 2020-11-07 DIAGNOSIS — Z7984 Long term (current) use of oral hypoglycemic drugs: Secondary | ICD-10-CM | POA: Diagnosis not present

## 2020-11-07 DIAGNOSIS — K648 Other hemorrhoids: Secondary | ICD-10-CM | POA: Insufficient documentation

## 2020-11-07 DIAGNOSIS — K635 Polyp of colon: Secondary | ICD-10-CM

## 2020-11-07 DIAGNOSIS — Z85828 Personal history of other malignant neoplasm of skin: Secondary | ICD-10-CM | POA: Insufficient documentation

## 2020-11-07 DIAGNOSIS — D124 Benign neoplasm of descending colon: Secondary | ICD-10-CM | POA: Insufficient documentation

## 2020-11-07 DIAGNOSIS — K5730 Diverticulosis of large intestine without perforation or abscess without bleeding: Secondary | ICD-10-CM | POA: Insufficient documentation

## 2020-11-07 HISTORY — PX: COLONOSCOPY WITH PROPOFOL: SHX5780

## 2020-11-07 LAB — GLUCOSE, CAPILLARY: Glucose-Capillary: 185 mg/dL — ABNORMAL HIGH (ref 70–99)

## 2020-11-07 SURGERY — COLONOSCOPY WITH PROPOFOL
Anesthesia: General

## 2020-11-07 MED ORDER — PROPOFOL 500 MG/50ML IV EMUL
INTRAVENOUS | Status: DC | PRN
Start: 2020-11-07 — End: 2020-11-07
  Administered 2020-11-07: 150 ug/kg/min via INTRAVENOUS

## 2020-11-07 MED ORDER — SODIUM CHLORIDE 0.9 % IV SOLN
INTRAVENOUS | Status: DC
Start: 2020-11-07 — End: 2020-11-07

## 2020-11-07 MED ORDER — PROPOFOL 10 MG/ML IV BOLUS
INTRAVENOUS | Status: DC | PRN
Start: 2020-11-07 — End: 2020-11-07
  Administered 2020-11-07: 80 mg via INTRAVENOUS

## 2020-11-07 NOTE — Anesthesia Preprocedure Evaluation (Signed)
Anesthesia Evaluation  Patient identified by MRN, date of birth, ID band Patient awake    Reviewed: Allergy & Precautions, NPO status , Patient's Chart, lab work & pertinent test results  History of Anesthesia Complications Negative for: history of anesthetic complications  Airway Mallampati: III       Dental  (+) Dental Advidsory Given, Teeth Intact   Pulmonary neg pulmonary ROS,    Pulmonary exam normal        Cardiovascular Exercise Tolerance: Good hypertension, (-) angina(-) Past MI and (-) Cardiac Stents Normal cardiovascular exam(-) dysrhythmias (-) Valvular Problems/Murmurs     Neuro/Psych negative neurological ROS  negative psych ROS   GI/Hepatic Neg liver ROS,   Endo/Other  diabetesMorbid obesity  Renal/GU CRFRenal disease  negative genitourinary   Musculoskeletal negative musculoskeletal ROS (+)   Abdominal Normal abdominal exam  (+)   Peds negative pediatric ROS (+)  Hematology negative hematology ROS (+)   Anesthesia Other Findings Past Medical History: 01/22/2019: Basal cell carcinoma     Comment:  Left upper arm. Nodular pattern. Tx: EDC No date: Diabetes mellitus without complication (HCC)  Reproductive/Obstetrics                             Anesthesia Physical  Anesthesia Plan  ASA: 3  Anesthesia Plan: General   Post-op Pain Management:    Induction: Intravenous  PONV Risk Score and Plan: 3 and TIVA and Propofol infusion  Airway Management Planned: Natural Airway and Nasal Cannula  Additional Equipment:   Intra-op Plan:   Post-operative Plan: Extubation in OR  Informed Consent: I have reviewed the patients History and Physical, chart, labs and discussed the procedure including the risks, benefits and alternatives for the proposed anesthesia with the patient or authorized representative who has indicated his/her understanding and acceptance.     Dental  advisory given  Plan Discussed with: CRNA and Surgeon  Anesthesia Plan Comments:         Anesthesia Quick Evaluation

## 2020-11-07 NOTE — Transfer of Care (Signed)
Immediate Anesthesia Transfer of Care Note  Patient: Alexandra Novak [facility]  Procedure(s) Performed: COLONOSCOPY WITH PROPOFOL  Patient Location: PACU and Endoscopy Unit  Anesthesia Type:General  Level of Consciousness: drowsy  Airway & Oxygen Therapy: Patient Spontanous Breathing  Post-op Assessment: Report given to RN  Post vital signs: stable  Last Vitals:  Vitals Value Taken Time  BP    Temp    Pulse    Resp    SpO2      Last Pain:  Vitals:   11/07/20 0832  TempSrc: Temporal  PainSc: 0-No pain         Complications: No notable events documented.

## 2020-11-07 NOTE — Op Note (Signed)
[facility] Patient Name: Alexandra Novak Procedure Date: 11/07/2020 9:01 AM MRN: [id number] Account #: [id number] Date of Birth: 1945/12/25 Admit Type: Outpatient Age: 75 Room: [facility] ENDO ROOM 2 Gender: Female Note Status: Finalized Procedure:             Colonoscopy Indications:           Screening for colorectal malignant neoplasm Providers:             Lucilla Lame MD, MD Referring MD:          Baxter Hire, MD (Referring MD) Medicines:             Propofol per Anesthesia Complications:         No immediate complications. Procedure:             Pre-Anesthesia Assessment:                        - Prior to the procedure, a History and Physical was                         performed, and patient medications and allergies were                         reviewed. The patient's tolerance of previous                         anesthesia was also reviewed. The risks and benefits                         of the procedure and the sedation options and risks                         were discussed with the patient. All questions were                         answered, and informed consent was obtained. Prior                         Anticoagulants: The patient has taken no previous                         anticoagulant or antiplatelet agents. ASA Grade                         Assessment: II - A patient with mild systemic disease.                         After reviewing the risks and benefits, the patient                         was deemed in satisfactory condition to undergo the                         procedure.                        After obtaining informed consent, the colonoscope was  passed under direct vision. Throughout the procedure,                         the patient's blood pressure, pulse, and oxygen                         saturations were monitored continuously. The                         Colonoscope was introduced through the  anus and                         advanced to the the cecum, identified by appendiceal                         orifice and ileocecal valve. The colonoscopy was                         performed without difficulty. The patient tolerated                         the procedure well. The quality of the bowel                         preparation was excellent. Findings:      The perianal and digital rectal examinations were normal.      Three sessile polyps were found in the descending colon. The polyps were       3 to 4 mm in size. These polyps were removed with a cold snare.       Resection and retrieval were complete.      A 3 mm polyp was found in the sigmoid colon. The polyp was sessile. The       polyp was removed with a cold snare. Resection and retrieval were       complete.      A 3 mm polyp was found in the rectum. The polyp was sessile. The polyp       was removed with a cold snare. Resection and retrieval were complete.      Multiple small-mouthed diverticula were found in the entire colon.      Non-bleeding internal hemorrhoids were found during retroflexion. The       hemorrhoids were Grade I (internal hemorrhoids that do not prolapse). Impression:            - Three 3 to 4 mm polyps in the descending colon,                         removed with a cold snare. Resected and retrieved.                        - One 3 mm polyp in the sigmoid colon, removed with a                         cold snare. Resected and retrieved.                        - One 3 mm polyp in the rectum, removed with a cold  snare. Resected and retrieved.                        - Diverticulosis in the entire examined colon.                        - Non-bleeding internal hemorrhoids. Recommendation:        - Discharge patient to home.                        - Resume previous diet.                        - Continue present medications.                        - Await pathology results.                         - Repeat colonoscopy is not recommended due to current                         age (81 years or older) for surveillance. Procedure Code(s):     --- Professional ---                        [phone], Colonoscopy, flexible; with removal of                         tumor(s), polyp(s), or other lesion(s) by snare                         technique Diagnosis Code(s):     --- Professional ---                        Z12.11, Encounter for screening for malignant neoplasm                         of colon                        K63.5, Polyp of colon CPT copyright 2019 American Medical Association. All rights reserved. The codes documented in this report are preliminary and upon coder review may  be revised to meet current compliance requirements. Lucilla Lame MD, MD 11/07/2020 9:24:54 AM This report has been signed electronically. Number of Addenda: 0 Note Initiated On: 11/07/2020 9:01 AM Scope Withdrawal Time: 0 hours 7 minutes 49 seconds  Total Procedure Duration: 0 hours 12 minutes 57 seconds  Estimated Blood Loss:  Estimated blood loss: none.      [facility]

## 2020-11-07 NOTE — H&P (Signed)
Lucilla Lame, MD [facility] [address] [place], [place] [address] Phone: [phone] Fax : [phone]  Primary Care Physician:  Baxter Hire, MD Primary Gastroenterologist:  Dr. Allen Norris  Pre-Procedure History & Physical: HPI:  Alexandra Novak is a 75 y.o. female is here for a screening colonoscopy.   Past Medical History:  Diagnosis Date   Basal cell carcinoma 01/22/2019   Left upper arm. Nodular pattern. Tx: EDC   Diabetes mellitus without complication ([place])     Past Surgical History:  Procedure Laterality Date   APPENDECTOMY     DIAGNOSTIC LAPAROSCOPY     LAPAROSCOPIC APPENDECTOMY N/A 09/02/2020   Procedure: APPENDECTOMY LAPAROSCOPIC;  Surgeon: Olean Ree, MD;  Location: [facility] ORS;  Service: General;  Laterality: N/A;    Prior to Admission medications   Medication Sig Start Date End Date Taking? Authorizing Provider  amLODipine (NORVASC) 10 MG tablet TAKE 1 TABLET(10 MG) BY MOUTH EVERY DAY 08/18/19  Yes [provider]  fexofenadine (ALLEGRA) 180 MG tablet Take 180 mg by mouth daily.   Yes [provider]  HUMALOG 100 UNIT/ML injection IN 20 UNITS UNDER THE SKIN BEFORE BREAKFAST AND 30 UNITS BEFORE SUPPER 08/04/19  Yes [provider]  hydrochlorothiazide (HYDRODIURIL) 25 MG tablet Take 25 mg by mouth daily. 07/19/19  Yes [provider]  insulin glargine (LANTUS) 100 UNIT/ML injection INJECT 80 UNIS UNDER THE SKIN EVERY NIGHT AT BEDTIME 09/06/19  Yes [provider]  lisinopril (ZESTRIL) 20 MG tablet Take 20 mg by mouth 2 (two) times daily. 06/16/19  Yes [provider]  magnesium oxide (MAG-OX) 400 MG tablet Take 400 mg by mouth daily.   Yes [provider]  metFORMIN (GLUCOPHAGE) 500 MG tablet Take 500 mg by mouth daily. 07/22/19  Yes [provider]  metoprolol tartrate (LOPRESSOR) 100 MG tablet Take 100 mg by mouth 2 (two) times daily.   Yes [provider]  omeprazole (PRILOSEC) 20 MG  capsule Take 20 mg by mouth daily.   Yes [provider]  pravastatin (PRAVACHOL) 40 MG tablet TAKE 1 TABLET BY MOUTH NIGHTLY 08/09/19  Yes [provider]  ACCU-CHEK AVIVA PLUS test strip 1 each by Other route daily. as directed 07/14/19   [provider]  Accu-Chek Softclix Lancets lancets 1 each by Other route daily. 07/17/19   [provider]    Allergies as of 10/25/2020   (No Known Allergies)    History reviewed. No pertinent family history.  Social History   Socioeconomic History   Marital status: Divorced    Spouse name: Not on file   Number of children: Not on file   Years of education: Not on file   Highest education level: Not on file  Occupational History   Not on file  Tobacco Use   Smoking status: Never   Smokeless tobacco: Never  Substance and Sexual Activity   Alcohol use: Never   Drug use: Never   Sexual activity: Not on file  Other Topics Concern   Not on file  Social History Narrative   Not on file   Social Determinants of Health   Financial Resource Strain: Not on file  Food Insecurity: Not on file  Transportation Needs: Not on file  Physical Activity: Not on file  Stress: Not on file  Social Connections: Not on file  Intimate Partner Violence: Not on file    Review of Systems: See HPI, otherwise negative ROS  Physical Exam: BP (!) 166/70  Pulse 72   Temp (!) 97.2 F (36.2 C) (Temporal)   Resp 18   Ht 5\' 1"  (1.549 m)   Wt 98.4 kg   SpO2 99%   BMI 41.00 kg/m  General:   Alert,  pleasant and cooperative in NAD Head:  Normocephalic and atraumatic. Neck:  Supple; no masses or thyromegaly. Lungs:  Clear throughout to auscultation.    Heart:  Regular rate and rhythm. Abdomen:  Soft, nontender and nondistended. Normal bowel sounds, without guarding, and without rebound.   Neurologic:  Alert and  oriented x4;  grossly normal neurologically.  Impression/Plan: Alexandra Novak is now here to undergo a  screening colonoscopy.  Risks, benefits, and alternatives regarding colonoscopy have been reviewed with the patient.  Questions have been answered.  All parties agreeable.

## 2020-11-07 NOTE — Anesthesia Postprocedure Evaluation (Signed)
Anesthesia Post Note  Patient: [facility]  Procedure(s) Performed: COLONOSCOPY WITH PROPOFOL  Patient location during evaluation: Endoscopy Anesthesia Type: General Level of consciousness: awake and alert Pain management: pain level controlled Vital Signs Assessment: post-procedure vital signs reviewed and stable Respiratory status: spontaneous breathing, nonlabored ventilation, respiratory function stable and patient connected to nasal cannula oxygen Cardiovascular status: blood pressure returned to baseline and stable Postop Assessment: no apparent nausea or vomiting Anesthetic complications: no   No notable events documented.   Last Vitals:  Vitals:   11/07/20 0946 11/07/20 0956  BP: (!) 134/55 (!) 119/53  Pulse: 67   Resp: 16   Temp:    SpO2: 100%     Last Pain:  Vitals:   11/07/20 0956  TempSrc:   PainSc: 0-No pain                 Martha Clan

## 2020-11-08 ENCOUNTER — Encounter: Payer: Self-pay | Admitting: Gastroenterology

## 2020-11-08 LAB — SURGICAL PATHOLOGY

## 2020-11-15 ENCOUNTER — Other Ambulatory Visit: Payer: Self-pay

## 2020-11-15 ENCOUNTER — Ambulatory Visit (INDEPENDENT_AMBULATORY_CARE_PROVIDER_SITE_OTHER): Payer: Medicare Other | Admitting: Surgery

## 2020-11-15 ENCOUNTER — Encounter: Payer: Self-pay | Admitting: Surgery

## 2020-11-15 VITALS — BP 138/85 | HR 73 | Temp 98.3°F | Ht 61.0 in | Wt 219.0 lb

## 2020-11-15 DIAGNOSIS — C181 Malignant neoplasm of appendix: Secondary | ICD-10-CM

## 2020-11-15 DIAGNOSIS — Z09 Encounter for follow-up examination after completed treatment for conditions other than malignant neoplasm: Secondary | ICD-10-CM

## 2020-11-15 NOTE — Progress Notes (Signed)
11/15/2020  HPI: Alexandra Novak is a 75 y.o. female s/p laparoscopic appendectomy on 4/23 for perforated appendicitis.  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  She has completed the staging workup with CT chest which was also negative for metastasis, MRI liver which was negative, and colonoscopy which showed tubular adenomas but no high-grade dysplasia or malignancy.    She presents today to discuss all the results and to discuss steps going forward.  From the appendix standpoint, she has healed well and denies any abdominal pain, nausea, vomiting.  Vital signs: BP 138/85   Pulse 73   Temp 98.3 F (36.8 C)   Ht 5\' 1"  (1.549 m)   Wt 219 lb (99.3 kg)   SpO2 98%   BMI 41.38 kg/m    Physical Exam: Constitutional:  No acute distress Abdomen:  soft, obese, non-distended, non-tender to palpation.  Incisions have healed well, without any infection, wound breakdown, or other issues.  Assessment/Plan: This is a 75 y.o. female s/p laparoscopic appendectomy.  --Patient has healed well after her surgery.  Discussed with her again the initial pathology results and that this is a less common type of cancer.  This used to be under a different cancer classification, but now is part of adenocarcinoma classification and is treated as colon cancer.  From that standpoint, the rest of her work up is negative, but per guidelines, the recommendation would be to proceed with a right colectomy to do an appropriate oncologic resection.  Discussed with the patient the reasoning behind this recommendation.  Reviewed with her the proposed surgery as well.  However, after further thought and discussion today, she has elected to not proceed with further surgery.  At the very minimum, we know that the appendectomy resection margins were negative and the tip of the appendix which had the perforation was also clear of malignancy, and two lymph nodes were  negative.   --She will still require follow up and I think it'll be best to do a referral to Oncology for further discussion with them and follow up.  Discussed with the patient that she may need serial CT scans, follow up colonoscopy, and potential labs in the future.  --If there are any concerns, she can always contact us and we can assist with anything that's needed.   Melvyn Neth, [facility]

## 2020-11-15 NOTE — Patient Instructions (Addendum)
We will resend the referral to Oncology at [facility]. They will call you to schedule this.  Follow-up with our office as needed.  Please call and ask to speak with a nurse if you develop questions or concerns.   Call us if you would like to talk about having surgery.

## 2022-08-07 ENCOUNTER — Ambulatory Visit: Payer: Medicare Other | Admitting: Dermatology

## 2022-08-07 ENCOUNTER — Encounter: Payer: Self-pay | Admitting: Dermatology

## 2022-08-07 VITALS — BP 156/73 | HR 77

## 2022-08-07 DIAGNOSIS — C44619 Basal cell carcinoma of skin of left upper limb, including shoulder: Secondary | ICD-10-CM | POA: Diagnosis not present

## 2022-08-07 DIAGNOSIS — L578 Other skin changes due to chronic exposure to nonionizing radiation: Secondary | ICD-10-CM | POA: Diagnosis not present

## 2022-08-07 DIAGNOSIS — D492 Neoplasm of unspecified behavior of bone, soft tissue, and skin: Secondary | ICD-10-CM

## 2022-08-07 NOTE — Progress Notes (Signed)
   New Patient Visit   Subjective  CHI CRISE is a 77 y.o. female who presents for the following: Spot. Left upper arm. Hx of BCC at site in 2020, Tx with EDC. This lesion is at edge of scar.  Pt declines full skin check.    The following portions of the chart were reviewed this encounter and updated as appropriate: medications, allergies, medical history  Review of Systems:  No other skin or systemic complaints except as noted in HPI or Assessment and Plan.  Objective  Well appearing patient in no apparent distress; mood and affect are within normal limits.  A focused examination was performed of the following areas: Left upper arm  Relevant physical exam findings are noted in the Assessment and Plan.  Left Upper Arm 0.7 cm pink pearly papule at edge of BCC scar       Assessment & Plan   Neoplasm of skin Left Upper Arm  Epidermal / dermal shaving  Lesion diameter (cm):  0.7 Informed consent: discussed and consent obtained   Patient was prepped and draped in usual sterile fashion: Area prepped with alcohol. Anesthesia: the lesion was anesthetized in a standard fashion   Anesthetic:  1% lidocaine w/ epinephrine 1-100,000 buffered w/ 8.4% NaHCO3 Instrument used: flexible razor blade   Hemostasis achieved with: pressure, aluminum chloride and electrodesiccation   Outcome: patient tolerated procedure well   Post-procedure details: wound care instructions given   Post-procedure details comment:  Ointment and small bandage applied  Destruction of lesion  Destruction method: electrodesiccation and curettage   Timeout:  patient name, date of birth, surgical site, and procedure verified Anesthesia: the lesion was anesthetized in a standard fashion   Anesthetic:  1% lidocaine w/ epinephrine 1-100,000 buffered w/ 8.4% NaHCO3 Curettage performed in three different directions: Yes   Electrodesiccation performed over the curetted area: Yes   Curettage cycles:  3 Final  wound size (cm):  0.9 Hemostasis achieved with:  pressure, aluminum chloride and electrodesiccation Outcome: patient tolerated procedure well with no complications   Post-procedure details: wound care instructions given    Specimen 1 - Surgical pathology Differential Diagnosis: Recurrent BCC  Check Margins: No Previous Pathology:  [id number]   ACTINIC DAMAGE - chronic, secondary to cumulative UV radiation exposure/sun exposure over time - diffuse erythematous macules with underlying dyspigmentation - Recommend daily broad spectrum sunscreen SPF 30+ to sun-exposed areas, reapply every 2 hours as needed.  - Recommend staying in the shade or wearing long sleeves, sun glasses (UVA+UVB protection) and wide brim hats (4-inch brim around the entire circumference of the hat). - Call for new or changing lesions.   Return in about 6 months (around 02/07/2023) for [facility] recheck.  I, Emelia Salisbury, CMA, am acting as scribe for Brendolyn Patty, MD.   Documentation: I have reviewed the above documentation for accuracy and completeness, and I agree with the above.  Brendolyn Patty, MD

## 2022-08-07 NOTE — Patient Instructions (Addendum)
Wound Care Instructions  Cleanse wound gently with soap and water once a day then pat dry with clean gauze. Apply a thin coat of Petrolatum (petroleum jelly, "Vaseline") over the wound (unless you have an allergy to this). We recommend that you use a new, sterile tube of Vaseline. Do not pick or remove scabs. Do not remove the yellow or white "healing tissue" from the base of the wound.  Cover the wound with fresh, clean, nonstick gauze and secure with paper tape. You may use Band-Aids in place of gauze and tape if the wound is small enough, but would recommend trimming much of the tape off as there is often too much. Sometimes Band-Aids can irritate the skin.  You should call the office for your biopsy report after 1 week if you have not already been contacted.  If you experience any problems, such as abnormal amounts of bleeding, swelling, significant bruising, significant pain, or evidence of infection, please call the office immediately.  FOR ADULT SURGERY PATIENTS: If you need something for pain relief you may take 1 extra strength Tylenol (acetaminophen) AND 2 Ibuprofen (200mg each) together every 4 hours as needed for pain. (do not take these if you are allergic to them or if you have a reason you should not take them.) Typically, you may only need pain medication for 1 to 3 days.     Due to recent changes in healthcare laws, you may see results of your pathology and/or laboratory studies on MyChart before the doctors have had a chance to review them. We understand that in some cases there may be results that are confusing or concerning to you. Please understand that not all results are received at the same time and often the doctors may need to interpret multiple results in order to provide you with the best plan of care or course of treatment. Therefore, we ask that you please give us 2 business days to thoroughly review all your results before contacting the office for clarification. Should  we see a critical lab result, you will be contacted sooner.   If You Need Anything After Your Visit  If you have any questions or concerns for your doctor, please call our main line at [phone] and press option 4 to reach your doctor's medical assistant. If no one answers, please leave a voicemail as directed and we will return your call as soon as possible. Messages left after 4 pm will be answered the following business day.   You may also send us a message via MyChart. We typically respond to MyChart messages within 1-2 business days.  For prescription refills, please ask your pharmacy to contact our office. Our fax number is [phone].  If you have an urgent issue when the clinic is closed that cannot wait until the next business day, you can page your doctor at the number below.    Please note that while we do our best to be available for urgent issues outside of office hours, we are not available 24/7.   If you have an urgent issue and are unable to reach us, you may choose to seek medical care at your doctor's office, retail clinic, urgent care center, or emergency room.  If you have a medical emergency, please immediately call 911 or go to the emergency department.  Pager Numbers  - Dr. Kowalski: [phone]  - Dr. Moye: [phone]  - Dr. Stewart: [phone]  In the event of inclement weather, please call our main line at   [phone] for an update on the status of any delays or closures.  Dermatology Medication Tips: Please keep the boxes that topical medications come in in order to help keep track of the instructions about where and how to use these. Pharmacies typically print the medication instructions only on the boxes and not directly on the medication tubes.   If your medication is too expensive, please contact our office at [phone] option 4 or send us a message through MyChart.   We are unable to tell what your co-pay for medications will be in  advance as this is different depending on your insurance coverage. However, we may be able to find a substitute medication at lower cost or fill out paperwork to get insurance to cover a needed medication.   If a prior authorization is required to get your medication covered by your insurance company, please allow us 1-2 business days to complete this process.  Drug prices often vary depending on where the prescription is filled and some pharmacies may offer cheaper prices.  The website www.goodrx.com contains coupons for medications through different pharmacies. The prices here do not account for what the cost may be with help from insurance (it may be cheaper with your insurance), but the website can give you the price if you did not use any insurance.  - You can print the associated coupon and take it with your prescription to the pharmacy.  - You may also stop by our office during regular business hours and pick up a GoodRx coupon card.  - If you need your prescription sent electronically to a different pharmacy, notify our office through [place] MyChart or by phone at [phone] option 4.     Si Usted Necesita Algo Despus de Su Visita  Tambin puede enviarnos un mensaje a travs de MyChart. Por lo general respondemos a los mensajes de MyChart en el transcurso de 1 a 2 das hbiles.  Para renovar recetas, por favor pida a su farmacia que se ponga en contacto con nuestra oficina. Nuestro nmero de fax es el [phone].  Si tiene un asunto urgente cuando la clnica est cerrada y que no puede esperar hasta el siguiente da hbil, puede llamar/localizar a su doctor(a) al nmero que aparece a continuacin.   Por favor, tenga en cuenta que aunque hacemos todo lo posible para estar disponibles para asuntos urgentes fuera del horario de oficina, no estamos disponibles las 24 horas del da, los 7 das de la semana.   Si tiene un problema urgente y no puede comunicarse con nosotros, puede  optar por buscar atencin mdica  en el consultorio de su doctor(a), en una clnica privada, en un centro de atencin urgente o en una sala de emergencias.  Si tiene una emergencia mdica, por favor llame inmediatamente al 911 o vaya a la sala de emergencias.  Nmeros de bper  - Dr. Kowalski: [phone]  - Dra. Moye: [phone]  - Dra. Stewart: [phone]  En caso de inclemencias del tiempo, por favor llame a nuestra lnea principal al [phone] para una actualizacin sobre el estado de cualquier retraso o cierre.  Consejos para la medicacin en dermatologa: Por favor, guarde las cajas en las que vienen los medicamentos de uso tpico para ayudarle a seguir las instrucciones sobre dnde y cmo usarlos. Las farmacias generalmente imprimen las instrucciones del medicamento slo en las cajas y no directamente en los tubos del medicamento.   Si su medicamento es muy caro, por favor, pngase en contacto con   nuestra oficina llamando al [phone] y presione la opcin 4 o envenos un mensaje a travs de MyChart.   No podemos decirle cul ser su copago por los medicamentos por adelantado ya que esto es diferente dependiendo de la cobertura de su seguro. Sin embargo, es posible que podamos encontrar un medicamento sustituto a menor costo o llenar un formulario para que el seguro cubra el medicamento que se considera necesario.   Si se requiere una autorizacin previa para que su compaa de seguros cubra su medicamento, por favor permtanos de 1 a 2 das hbiles para completar este proceso.  Los precios de los medicamentos varan con frecuencia dependiendo del lugar de dnde se surte la receta y alguna farmacias pueden ofrecer precios ms baratos.  El sitio web www.goodrx.com tiene cupones para medicamentos de diferentes farmacias. Los precios aqu no tienen en cuenta lo que podra costar con la ayuda del seguro (puede ser ms barato con su seguro), pero el sitio web puede darle el  precio si no utiliz ningn seguro.  - Puede imprimir el cupn correspondiente y llevarlo con su receta a la farmacia.  - Tambin puede pasar por nuestra oficina durante el horario de atencin regular y recoger una tarjeta de cupones de GoodRx.  - Si necesita que su receta se enve electrnicamente a una farmacia diferente, informe a nuestra oficina a travs de MyChart de [place] o por telfono llamando al [phone] y presione la opcin 4.  

## 2022-08-14 ENCOUNTER — Telehealth: Payer: Self-pay

## 2022-08-14 NOTE — Telephone Encounter (Signed)
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  Because it is recurrent, there is a higher risk it may come back again after EDC.  If so, it would need to be excised.    - please call patient

## 2022-08-14 NOTE — Telephone Encounter (Signed)
Left pt msg to call for bx result/sh °

## 2022-08-15 NOTE — Telephone Encounter (Signed)
Left pt another message to call for bx results./sh 

## 2022-08-20 NOTE — Telephone Encounter (Signed)
Left pt msg to call for bx results.  Will send letter to have patient call the office for results./sh

## 2022-08-27 ENCOUNTER — Telehealth: Payer: Self-pay

## 2022-08-27 NOTE — Telephone Encounter (Signed)
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  Because it is recurrent, there is a higher risk it may come back again after EDC.  If so, it would need to be excised.    - please call patient 

## 2022-08-27 NOTE — Telephone Encounter (Signed)
Patient advised of BX results .aw 

## 2022-12-13 IMAGING — CT CT CHEST W/O CM
2 of 3 series · 15 of 36 positions shown, 18 images · non-contrast
Comparison: None.

CLINICAL DATA: Colorectal carcinoma staging.

EXAM:
CT CHEST WITHOUT CONTRAST
TECHNIQUE: Multidetector CT imaging of the chest was performed following the
standard protocol without IV contrast.

[Series 2: thorax · axial · 0.64mm/px · z∈[-536,-288]mm · 12 of 146 slices shown, 15 images]
[im 11/146  mediastinal]
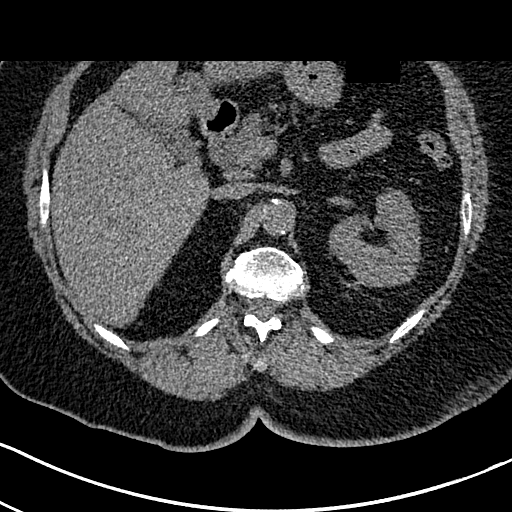
[im 11/146  lung]
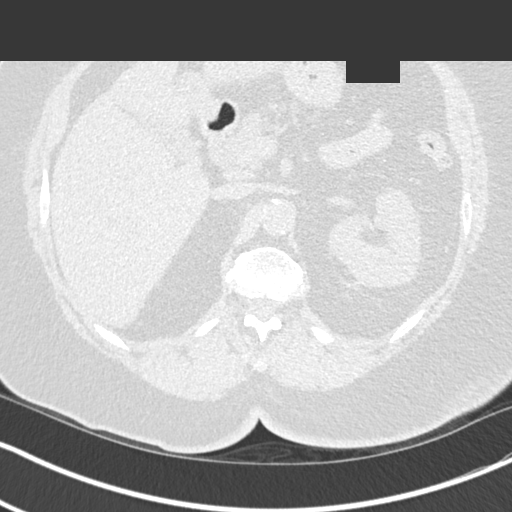
[im 22/146  lung]
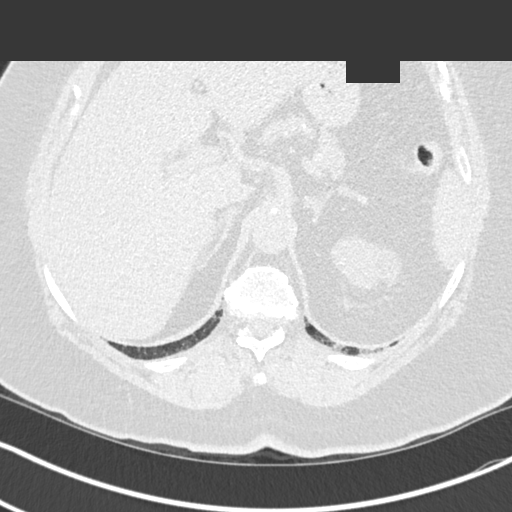
[im 33/146  lung]
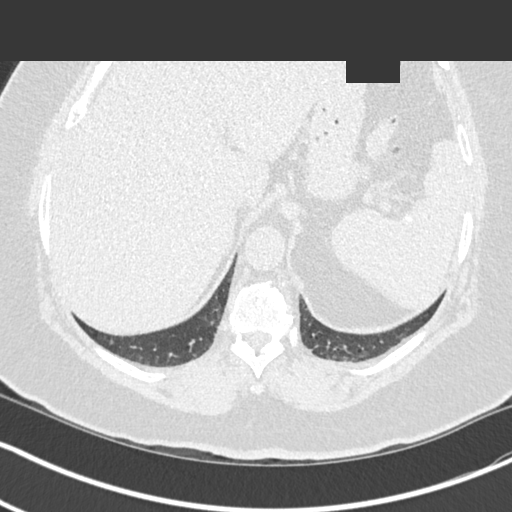
[im 43/146  lung]
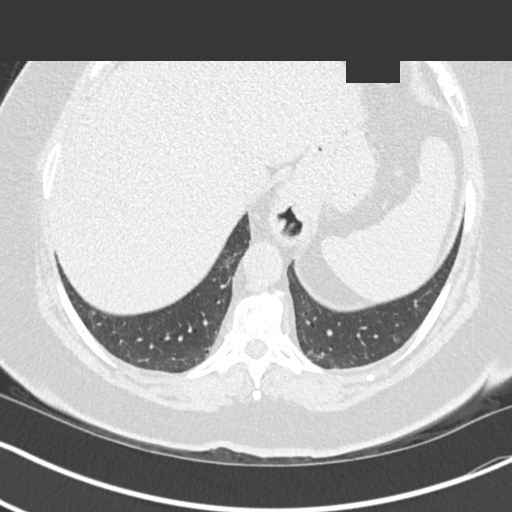
[im 54/146  mediastinal]
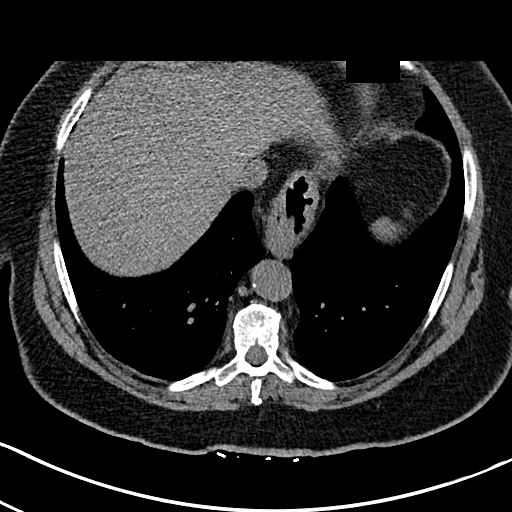
[im 54/146  lung]
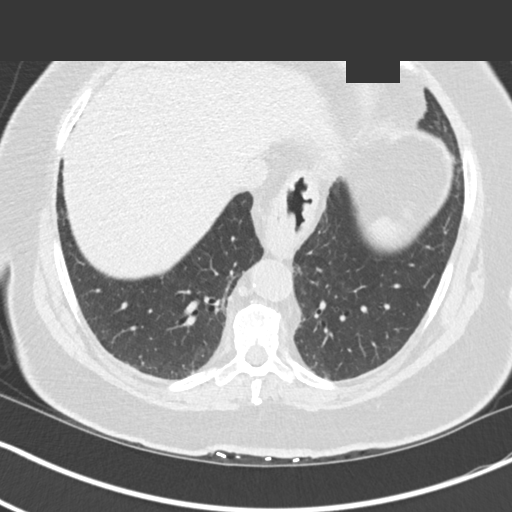
[im 65/146  lung]
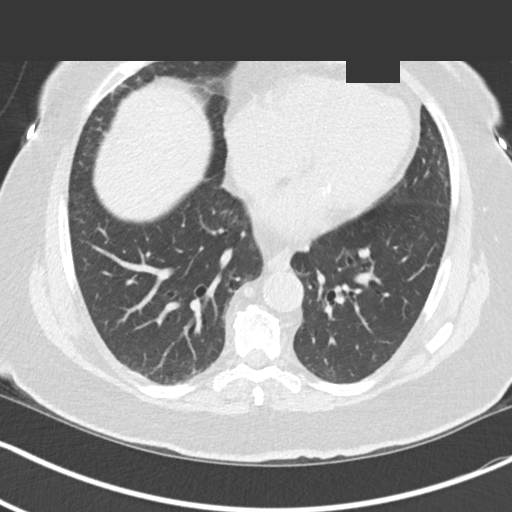
[im 81/146  lung]
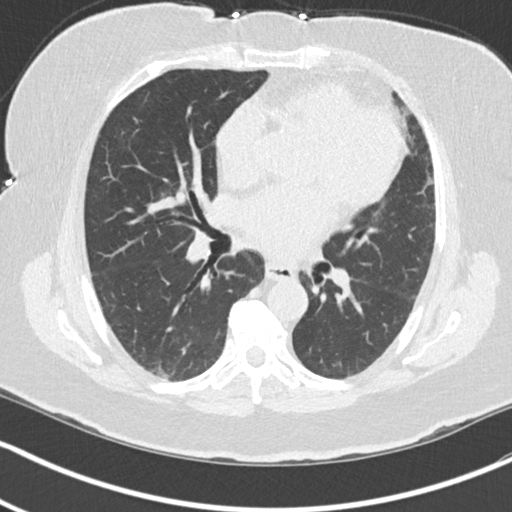
[im 92/146  lung]
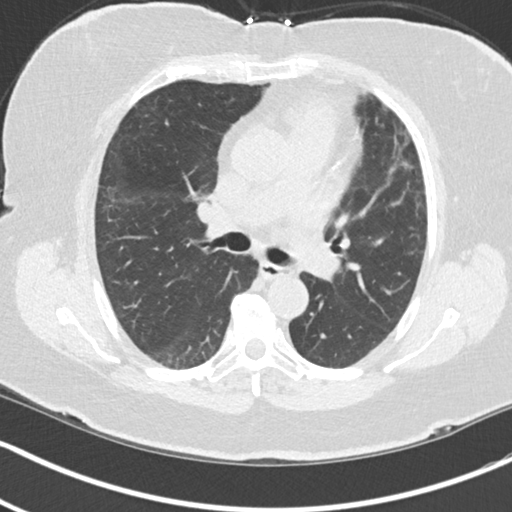
[im 103/146  mediastinal]
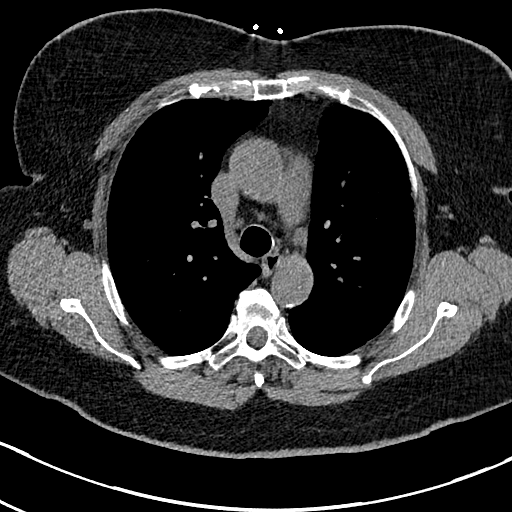
[im 103/146  lung]
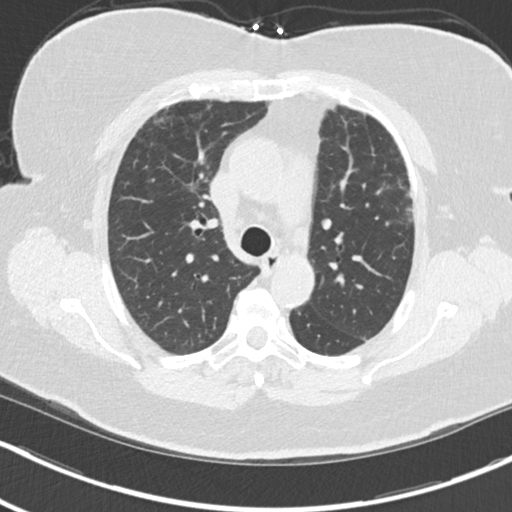
[im 113/146  lung]
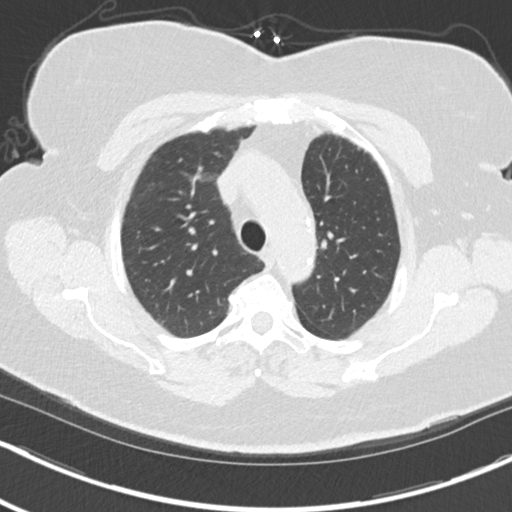
[im 124/146  lung]
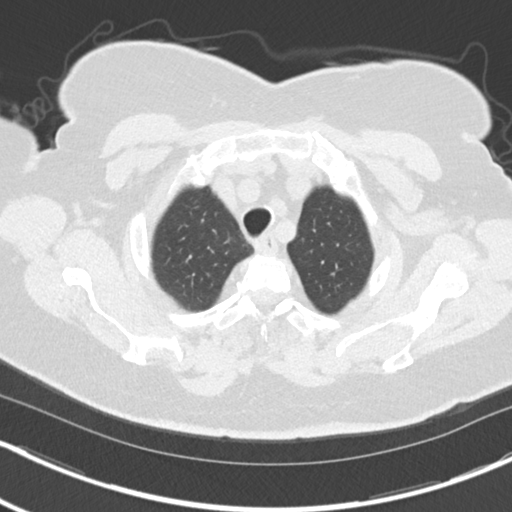
[im 135/146  lung]
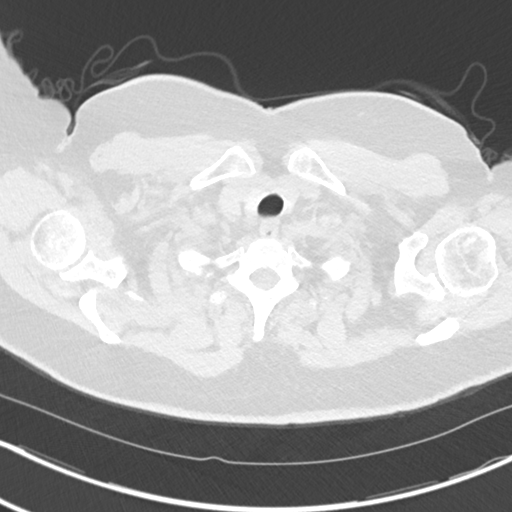

[Series 5: coronal · coronal · 0.59mm/px · 3 of 151 slices shown]
[im 31/151  lung]
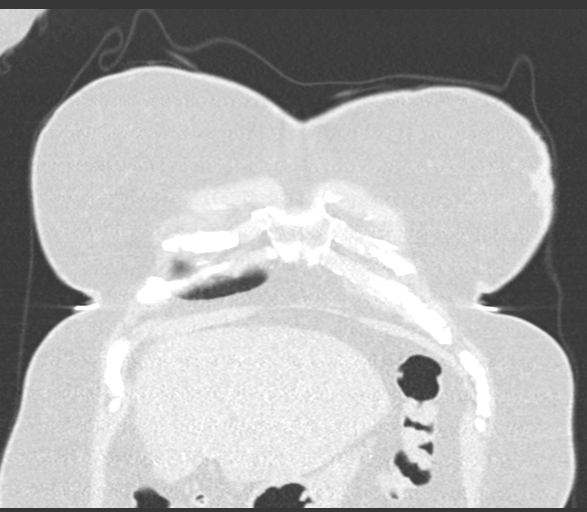
[im 61/151  lung]
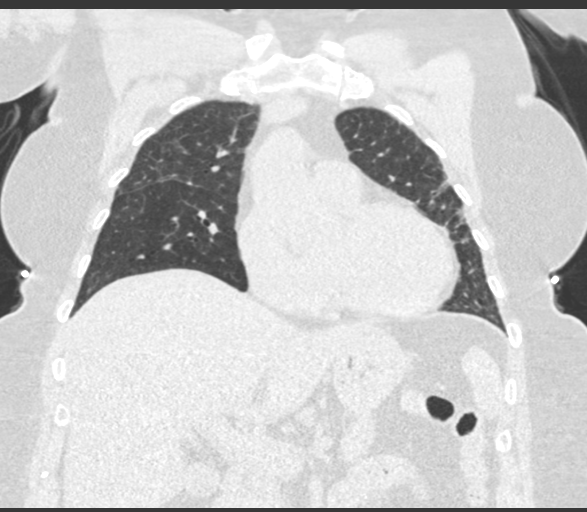
[im 91/151  lung]
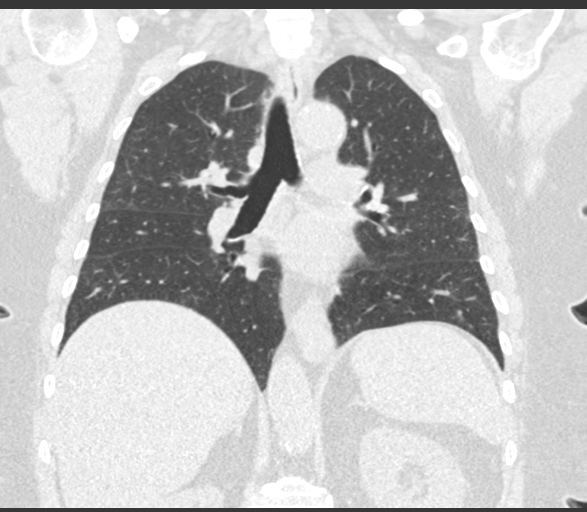

[15 of 36 positions shown; findings below may reference images not displayed]

FINDINGS: Cardiovascular: The heart size appears within normal limits. No
pericardial effusion. Aortic atherosclerosis and multi vessel
coronary artery calcifications.

Mediastinum/Nodes: No enlarged mediastinal or axillary lymph nodes.
Thyroid gland, trachea, and esophagus demonstrate no significant
findings.

Lungs/Pleura: No pleural effusion. Bilateral areas of peripheral
predominant subpleural reticulation is identified, likely
postinflammatory. No airspace consolidation, atelectasis or
pneumothorax. No suspicious lung nodules.

Upper Abdomen: No acute abnormality. Low-attenuation focus within
the right hepatic lobe is again noted measuring 2.3 cm on today's
unenhanced study, image 137/2. See report from contrast enhanced MRI
performed also today for further details.

Musculoskeletal: No chest wall mass or suspicious bone lesions
identified. Degenerative disc disease identified within the thoracic
spine.
IMPRESSION: 1. No acute cardiopulmonary abnormalities. No specific findings
identified to suggest metastatic disease to the chest.
2. Coronary artery calcifications.

Aortic Atherosclerosis ([id number]).

## 2023-03-18 ENCOUNTER — Ambulatory Visit: Payer: Medicare Other | Admitting: Dermatology

## 2023-03-18 DIAGNOSIS — L821 Other seborrheic keratosis: Secondary | ICD-10-CM | POA: Diagnosis not present

## 2023-03-18 DIAGNOSIS — L814 Other melanin hyperpigmentation: Secondary | ICD-10-CM

## 2023-03-18 DIAGNOSIS — C44619 Basal cell carcinoma of skin of left upper limb, including shoulder: Secondary | ICD-10-CM | POA: Diagnosis not present

## 2023-03-18 DIAGNOSIS — L578 Other skin changes due to chronic exposure to nonionizing radiation: Secondary | ICD-10-CM

## 2023-03-18 NOTE — Patient Instructions (Addendum)
Electrodesiccation and Curettage ("Scrape and Burn") Wound Care Instructions  Leave the original bandage on for 24 hours if possible.  If the bandage becomes soaked or soiled before that time, it is OK to remove it and examine the wound.  A small amount of post-operative bleeding is normal.  If excessive bleeding occurs, remove the bandage, place gauze over the site and apply continuous pressure (no peeking) over the area for 30 minutes. If this does not work, please call our clinic as soon as possible or page your doctor if it is after hours.   Once a day, cleanse the wound with soap and water. It is fine to shower. If a thick crust develops you may use a Q-tip dipped into dilute hydrogen peroxide (mix 1:1 with water) to dissolve it.  Hydrogen peroxide can slow the healing process, so use it only as needed.    After washing, apply petroleum jelly (Vaseline) or an antibiotic ointment if your doctor prescribed one for you, followed by a bandage.    For best healing, the wound should be covered with a layer of ointment at all times. If you are not able to keep the area covered with a bandage to hold the ointment in place, this may mean re-applying the ointment several times a day.  Continue this wound care until the wound has healed and is no longer open. It may take several weeks for the wound to heal and close.  Itching and mild discomfort is normal during the healing process.  If you have any discomfort, you can take Tylenol (acetaminophen) or ibuprofen as directed on the bottle. (Please do not take these if you have an allergy to them or cannot take them for another reason).  Some redness, tenderness and white or yellow material in the wound is normal healing.  If the area becomes very sore and red, or develops a thick yellow-green material (pus), it may be infected; please notify us.    Wound healing continues for up to one year following surgery. It is not unusual to experience pain in the scar  from time to time during the interval.  If the pain becomes severe or the scar thickens, you should notify the office.    A slight amount of redness in a scar is expected for the first six months.  After six months, the redness will fade and the scar will soften and fade.  The color difference becomes less noticeable with time.  If there are any problems, return for a post-op surgery check at your earliest convenience.  To improve the appearance of the scar, you can use silicone scar gel, cream, or sheets (such as Mederma or Serica) every night for up to one year. These are available over the counter (without a prescription).  Please call our office at [phone] for any questions or concerns.   Biopsy Wound Care Instructions  Leave the original bandage on for 24 hours if possible.  If the bandage becomes soaked or soiled before that time, it is OK to remove it and examine the wound.  A small amount of post-operative bleeding is normal.  If excessive bleeding occurs, remove the bandage, place gauze over the site and apply continuous pressure (no peeking) over the area for 30 minutes. If this does not work, please call our clinic as soon as possible or page your doctor if it is after hours.   Once a day, cleanse the wound with soap and water. It is fine to shower.  If a thick crust develops you may use a Q-tip dipped into dilute hydrogen peroxide (mix 1:1 with water) to dissolve it.  Hydrogen peroxide can slow the healing process, so use it only as needed.    After washing, apply petroleum jelly (Vaseline) or an antibiotic ointment if your doctor prescribed one for you, followed by a bandage.    For best healing, the wound should be covered with a layer of ointment at all times. If you are not able to keep the area covered with a bandage to hold the ointment in place, this may mean re-applying the ointment several times a day.  Continue this wound care until the wound has healed and is no longer  open.   Itching and mild discomfort is normal during the healing process. However, if you develop pain or severe itching, please call our office.   If you have any discomfort, you can take Tylenol (acetaminophen) or ibuprofen as directed on the bottle. (Please do not take these if you have an allergy to them or cannot take them for another reason).  Some redness, tenderness and white or yellow material in the wound is normal healing.  If the area becomes very sore and red, or develops a thick yellow-green material (pus), it may be infected; please notify us.    If you have stitches, return to clinic as directed to have the stitches removed. You will continue wound care for 2-3 days after the stitches are removed.   Wound healing continues for up to one year following surgery. It is not unusual to experience pain in the scar from time to time during the interval.  If the pain becomes severe or the scar thickens, you should notify the office.    A slight amount of redness in a scar is expected for the first six months.  After six months, the redness will fade and the scar will soften and fade.  The color difference becomes less noticeable with time.  If there are any problems, return for a post-op surgery check at your earliest convenience.  To improve the appearance of the scar, you can use silicone scar gel, cream, or sheets (such as Mederma or Serica) every night for up to one year. These are available over the counter (without a prescription).  Please call our office at [phone] for any questions or concerns.       Due to recent changes in healthcare laws, you may see results of your pathology and/or laboratory studies on MyChart before the doctors have had a chance to review them. We understand that in some cases there may be results that are confusing or concerning to you. Please understand that not all results are received at the same time and often the doctors may need to interpret  multiple results in order to provide you with the best plan of care or course of treatment. Therefore, we ask that you please give [place] 2 business days to thoroughly review all your results before contacting the office for clarification. Should we see a critical lab result, you will be contacted sooner.   If You Need Anything After Your Visit  If you have any questions or concerns for your doctor, please call our main line at [phone] and press option 4 to reach your doctor's medical assistant. If no one answers, please leave a voicemail as directed and we will return your call as soon as possible. Messages left after 4 pm will be answered the following business day.  You may also send [place] a message via MyChart. We typically respond to MyChart messages within 1-2 business days.  For prescription refills, please ask your pharmacy to contact our office. Our fax number is [phone].  If you have an urgent issue when the clinic is closed that cannot wait until the next business day, you can page your doctor at the number below.    Please note that while we do our best to be available for urgent issues outside of office hours, we are not available 24/7.   If you have an urgent issue and are unable to reach [place], you may choose to seek medical care at your doctor's office, retail clinic, urgent care center, or emergency room.  If you have a medical emergency, please immediately call 911 or go to the emergency department.  Pager Numbers  - Dr. Gwen Pounds: [phone]  - Dr. Roseanne Reno: [phone]  - Dr. Katrinka Blazing: [phone]   In the event of inclement weather, please call our main line at [phone] for an update on the status of any delays or closures.  Dermatology Medication Tips: Please keep the boxes that topical medications come in in order to help keep track of the instructions about where and how to use these. Pharmacies typically print the medication instructions only on the boxes  and not directly on the medication tubes.   If your medication is too expensive, please contact our office at [phone] option 4 or send [place] a message through MyChart.   We are unable to tell what your co-pay for medications will be in advance as this is different depending on your insurance coverage. However, we may be able to find a substitute medication at lower cost or fill out paperwork to get insurance to cover a needed medication.   If a prior authorization is required to get your medication covered by your insurance company, please allow [place] 1-2 business days to complete this process.  Drug prices often vary depending on where the prescription is filled and some pharmacies may offer cheaper prices.  The website www.goodrx.com contains coupons for medications through different pharmacies. The prices here do not account for what the cost may be with help from insurance (it may be cheaper with your insurance), but the website can give you the price if you did not use any insurance.  - You can print the associated coupon and take it with your prescription to the pharmacy.  - You may also stop by our office during regular business hours and pick up a GoodRx coupon card.  - If you need your prescription sent electronically to a different pharmacy, notify our office through [facility] or by phone at [phone] option 4.     Si Usted Necesita Algo Despus de Su Visita  Tambin puede enviarnos un mensaje a travs de Clinical cytogeneticist. Por lo general respondemos a los mensajes de MyChart en el transcurso de 1 a 2 das hbiles.  Para renovar recetas, por favor pida a su farmacia que se ponga en contacto con nuestra oficina. Annie Sable de fax es [place] [phone].  Si tiene un asunto urgente cuando la clnica est cerrada y que no puede esperar hasta el siguiente da hbil, puede llamar/localizar a su doctor(a) al nmero que aparece a continuacin.   Por favor, tenga en cuenta que aunque  hacemos todo lo posible para estar disponibles para asuntos urgentes fuera del horario de [place], no estamos disponibles las 24 horas del da, los 7 [address]  [address] de la [place].  Si tiene un problema urgente y no puede comunicarse con nosotros, puede optar por buscar atencin mdica  en el consultorio de su doctor(a), en una clnica privada, en un centro de atencin urgente o en una sala de emergencias.  Si tiene Engineer, drilling, por favor llame inmediatamente al 911 o vaya a la sala de emergencias.  Nmeros de bper  - Dr. Gwen Pounds: [phone]  - Dra. Roseanne Reno: [phone]  - Dr. Katrinka Blazing: [phone]   En caso de inclemencias del tiempo, por favor llame a Lacy Duverney principal al [phone] para una actualizacin sobre el [place] de cualquier retraso o cierre.  Consejos para la medicacin en dermatologa: Por favor, guarde las cajas en las que vienen los medicamentos de uso tpico para ayudarle a seguir las instrucciones sobre dnde y cmo usarlos. Las farmacias generalmente imprimen las instrucciones del medicamento slo en las cajas y no directamente en los tubos del [place].   Si su medicamento es muy caro, por favor, pngase en contacto con Rolm Gala llamando al [phone] y presione la opcin 4 o envenos un mensaje a travs de Clinical cytogeneticist.   No podemos decirle cul ser su copago por los medicamentos por adelantado ya que esto es diferente dependiendo de la cobertura de su seguro. Sin embargo, es posible que podamos encontrar un medicamento sustituto a Audiological scientist un formulario para que el seguro cubra el medicamento que se considera necesario.   Si se requiere una autorizacin previa para que su compaa de seguros [place] su medicamento, por favor permtanos de 1 a 2 das hbiles para completar [address]  Los precios de los medicamentos varan con frecuencia dependiendo del Environmental consultant de dnde se surte la receta y alguna farmacias pueden ofrecer precios ms  baratos.  El sitio web www.goodrx.com tiene cupones para medicamentos de Health and safety inspector. Los precios aqu no tienen en cuenta lo que podra costar con la ayuda del seguro (puede ser ms barato con su seguro), pero el sitio web puede darle el precio si no utiliz Tourist information centre manager.  - Puede imprimir el cupn correspondiente y llevarlo con su receta a la farmacia.  - Tambin puede pasar por nuestra oficina durante el horario de atencin regular y Education officer, museum una tarjeta de cupones de GoodRx.  - Si necesita que su receta se enve electrnicamente a una farmacia diferente, informe a nuestra oficina a travs de MyChart de [place] o por telfono llamando al [phone] y presione la opcin 4.

## 2023-03-18 NOTE — Progress Notes (Signed)
   Follow-Up Visit   Subjective  Alexandra Novak is a 77 y.o. female who presents for the following: 6 month recheck bcc At left upper arm  The patient has spots, moles and lesions to be evaluated, some may be new or changing and the patient may have concern these could be cancer.   The following portions of the chart were reviewed this encounter and updated as appropriate: medications, allergies, medical history  Review of Systems:  No other skin or systemic complaints except as noted in HPI or Assessment and Plan.  Objective  Well appearing patient in no apparent distress; mood and affect are within normal limits.   A focused examination was performed of the following areas: Left upper arm   Relevant exam findings are noted in the Assessment and Plan.  Left Upper Arm - Anterior 0.7 mm pink pearly at lateral edge of scar         Assessment & Plan   LENTIGINES Exam: scattered tan macules arms Due to sun exposure Treatment Plan: Benign-appearing, observe. Recommend daily broad spectrum sunscreen SPF 30+ to sun-exposed areas, reapply every 2 hours as needed.  Call for any changes   SEBORRHEIC KERATOSIS - Stuck-on, waxy, tan-brown papules and/or plaques  - Benign-appearing - Discussed benign etiology and prognosis. - Observe - Call for any changes  HISTORY OF BASAL CELL CARCINOMA OF THE SKIN Left upper arm 07/2022 ED&C - with recurrence today at lateral edge see below - Recommend regular full body skin exams - Recommend daily broad spectrum sunscreen SPF 30+ to sun-exposed areas, reapply every 2 hours as needed.  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 Anesthesia: the lesion was anesthetized in a standard fashion   Anesthetic:  1% lidocaine w/ epinephrine 1-100,000 buffered w/ 8.4% NaHCO3 Instrument used: flexible razor blade   Hemostasis achieved with: pressure, aluminum chloride and electrodesiccation   Outcome: patient tolerated procedure well    Destruction of lesion  Destruction method: electrodesiccation and curettage   Informed consent: discussed and consent obtained   Curettage performed in three different directions: Yes   Electrodesiccation performed over the curetted area: Yes   Final wound size (cm):  1.2 Hemostasis achieved with:  pressure, aluminum chloride and electrodesiccation Outcome: patient tolerated procedure well with no complications   Post-procedure details: wound care instructions given   Additional details:  Mupirocin ointment and Bandaid applied   Specimen 1 - Surgical pathology Differential Diagnosis: r/o recurrent bcc   Check Margins: yes Refer to previous pathology  Collected: 08/07/2022 Client: [facility] Accession: [id number]   R/o recurrent bcc       Return in about 6 months (around 09/15/2023) for recheck hx of bcc .  I, Asher Muir, CMA, am acting as scribe for Willeen Niece, MD.   Documentation: I have reviewed the above documentation for accuracy and completeness, and I agree with the above.  Willeen Niece, MD

## 2023-03-21 LAB — SURGICAL PATHOLOGY

## 2023-03-24 ENCOUNTER — Telehealth: Payer: Self-pay

## 2023-03-24 NOTE — Telephone Encounter (Signed)
Left pt msg to call for bx result/sh °

## 2023-03-24 NOTE — Telephone Encounter (Signed)
-----   Message from Willeen Niece sent at 03/24/2023 11:45 AM EST ----- 1. Skin, left upper arm - anterior :       BASAL CELL CARCINOMA WITH FIBROSIS, DEEP MARGIN INVOLVED, SEE DESCRIPTION   BCC skin cancer- already treated with EDC at time of biopsy, will continue to monitor, if it recurs, will need excision    - please call patient

## 2023-03-25 NOTE — Telephone Encounter (Signed)
Left pt msg to call for bx result/sh °

## 2023-04-01 ENCOUNTER — Telehealth: Payer: Self-pay

## 2023-04-01 NOTE — Telephone Encounter (Signed)
Left message for patient to call for biopsy results. 

## 2023-04-03 NOTE — Telephone Encounter (Signed)
Patient advised of BX results .aw 

## 2023-09-16 ENCOUNTER — Ambulatory Visit: Payer: Medicare Other | Admitting: Dermatology
